# Patient Record
Sex: Male | Born: 1935 | Race: Black or African American | Hispanic: No | Marital: Single
Health system: Southern US, Community
[De-identification: ages and names within clinical notes are randomized; demographics above are authoritative.]

## PROBLEM LIST (undated history)

## (undated) DIAGNOSIS — C20 Malignant neoplasm of rectum: Secondary | ICD-10-CM

## (undated) DIAGNOSIS — I10 Essential (primary) hypertension: Secondary | ICD-10-CM

---

## 2003-12-17 ENCOUNTER — Ambulatory Visit: Payer: Self-pay | Admitting: Hematology and Oncology

## 2004-01-04 ENCOUNTER — Ambulatory Visit: Admission: RE | Admit: 2004-01-04 | Discharge: 2004-02-25 | Payer: Self-pay | Admitting: *Deleted

## 2004-01-05 ENCOUNTER — Ambulatory Visit (HOSPITAL_COMMUNITY): Admission: RE | Admit: 2004-01-05 | Discharge: 2004-01-05 | Payer: Self-pay | Admitting: Hematology and Oncology

## 2004-01-11 ENCOUNTER — Encounter: Admission: RE | Admit: 2004-01-11 | Discharge: 2004-01-11 | Payer: Self-pay | Admitting: Surgery

## 2004-01-12 ENCOUNTER — Ambulatory Visit (HOSPITAL_BASED_OUTPATIENT_CLINIC_OR_DEPARTMENT_OTHER): Admission: RE | Admit: 2004-01-12 | Discharge: 2004-01-12 | Payer: Self-pay | Admitting: Surgery

## 2004-01-12 ENCOUNTER — Ambulatory Visit (HOSPITAL_COMMUNITY): Admission: RE | Admit: 2004-01-12 | Discharge: 2004-01-12 | Payer: Self-pay | Admitting: Surgery

## 2004-02-03 ENCOUNTER — Ambulatory Visit: Payer: Self-pay | Admitting: Hematology and Oncology

## 2004-03-21 ENCOUNTER — Ambulatory Visit: Admission: RE | Admit: 2004-03-21 | Discharge: 2004-04-12 | Payer: Self-pay | Admitting: *Deleted

## 2004-03-31 ENCOUNTER — Inpatient Hospital Stay (HOSPITAL_COMMUNITY): Admission: RE | Admit: 2004-03-31 | Discharge: 2004-04-12 | Payer: Self-pay | Admitting: Surgery

## 2004-03-31 ENCOUNTER — Encounter (INDEPENDENT_AMBULATORY_CARE_PROVIDER_SITE_OTHER): Payer: Self-pay | Admitting: *Deleted

## 2004-06-15 ENCOUNTER — Ambulatory Visit: Payer: Self-pay | Admitting: Hematology and Oncology

## 2004-08-03 ENCOUNTER — Ambulatory Visit: Payer: Self-pay | Admitting: Hematology and Oncology

## 2004-09-25 ENCOUNTER — Ambulatory Visit: Payer: Self-pay | Admitting: Hematology and Oncology

## 2004-12-19 ENCOUNTER — Ambulatory Visit (HOSPITAL_COMMUNITY): Admission: RE | Admit: 2004-12-19 | Discharge: 2004-12-19 | Payer: Self-pay | Admitting: Hematology and Oncology

## 2004-12-21 ENCOUNTER — Ambulatory Visit: Payer: Self-pay | Admitting: Hematology and Oncology

## 2005-02-20 ENCOUNTER — Ambulatory Visit (HOSPITAL_BASED_OUTPATIENT_CLINIC_OR_DEPARTMENT_OTHER): Admission: RE | Admit: 2005-02-20 | Discharge: 2005-02-20 | Payer: Self-pay | Admitting: Surgery

## 2005-04-05 ENCOUNTER — Ambulatory Visit: Payer: Self-pay | Admitting: Hematology and Oncology

## 2005-04-05 LAB — CBC WITH DIFFERENTIAL/PLATELET
Basophils Absolute: 0 10*3/uL (ref 0.0–0.1)
EOS%: 5.1 % (ref 0.0–7.0)
Eosinophils Absolute: 0.1 10*3/uL (ref 0.0–0.5)
MCHC: 35 g/dL (ref 32.0–35.9)
MONO#: 0.5 10*3/uL (ref 0.1–0.9)
NEUT%: 50.2 % (ref 40.0–75.0)
Platelets: 160 10*3/uL (ref 145–400)
RBC: 4.43 10*6/uL (ref 4.20–5.71)
WBC: 2.4 10*3/uL — ABNORMAL LOW (ref 4.0–10.0)

## 2005-04-05 LAB — COMPREHENSIVE METABOLIC PANEL
AST: 15 U/L (ref 0–37)
Albumin: 4.2 g/dL (ref 3.5–5.2)
BUN: 17 mg/dL (ref 6–23)
Calcium: 9.1 mg/dL (ref 8.4–10.5)
Chloride: 105 mEq/L (ref 96–112)
Creatinine, Ser: 1 mg/dL (ref 0.4–1.5)
Glucose, Bld: 120 mg/dL — ABNORMAL HIGH (ref 70–99)
Potassium: 4.4 mEq/L (ref 3.5–5.3)

## 2005-04-05 LAB — CEA: CEA: 0.9 ng/mL (ref 0.0–5.0)

## 2005-06-26 ENCOUNTER — Ambulatory Visit: Payer: Self-pay | Admitting: Hematology and Oncology

## 2005-07-02 LAB — COMPREHENSIVE METABOLIC PANEL
Albumin: 4.4 g/dL (ref 3.5–5.2)
Alkaline Phosphatase: 40 U/L (ref 39–117)
CO2: 24 mEq/L (ref 19–32)
Calcium: 9.1 mg/dL (ref 8.4–10.5)
Chloride: 105 mEq/L (ref 96–112)
Glucose, Bld: 90 mg/dL (ref 70–99)
Potassium: 4.5 mEq/L (ref 3.5–5.3)
Sodium: 136 mEq/L (ref 135–145)
Total Protein: 7.4 g/dL (ref 6.0–8.3)

## 2005-07-02 LAB — CBC WITH DIFFERENTIAL/PLATELET
Eosinophils Absolute: 0.1 10*3/uL (ref 0.0–0.5)
MONO#: 0.7 10*3/uL (ref 0.1–0.9)
MONO%: 19.4 % — ABNORMAL HIGH (ref 0.0–13.0)
NEUT#: 1.9 10*3/uL (ref 1.5–6.5)
RBC: 4.57 10*6/uL (ref 4.20–5.71)
RDW: 11.7 % (ref 11.2–14.6)
WBC: 3.5 10*3/uL — ABNORMAL LOW (ref 4.0–10.0)
lymph#: 0.6 10*3/uL — ABNORMAL LOW (ref 0.9–3.3)

## 2005-07-03 ENCOUNTER — Ambulatory Visit (HOSPITAL_COMMUNITY): Admission: RE | Admit: 2005-07-03 | Discharge: 2005-07-03 | Payer: Self-pay | Admitting: Hematology and Oncology

## 2005-11-13 ENCOUNTER — Ambulatory Visit: Payer: Self-pay | Admitting: Hematology and Oncology

## 2005-11-28 LAB — CBC WITH DIFFERENTIAL/PLATELET
Basophils Absolute: 0 10*3/uL (ref 0.0–0.1)
HCT: 39.5 % (ref 38.7–49.9)
HGB: 13.7 g/dL (ref 13.0–17.1)
LYMPH%: 23.2 % (ref 14.0–48.0)
MCH: 31.3 pg (ref 28.0–33.4)
MONO#: 0.6 10*3/uL (ref 0.1–0.9)
NEUT%: 53.4 % (ref 40.0–75.0)
Platelets: 174 10*3/uL (ref 145–400)
WBC: 3.1 10*3/uL — ABNORMAL LOW (ref 4.0–10.0)
lymph#: 0.7 10*3/uL — ABNORMAL LOW (ref 0.9–3.3)

## 2005-11-28 LAB — COMPREHENSIVE METABOLIC PANEL
Albumin: 4.2 g/dL (ref 3.5–5.2)
BUN: 16 mg/dL (ref 6–23)
CO2: 27 mEq/L (ref 19–32)
Glucose, Bld: 97 mg/dL (ref 70–99)
Potassium: 4.4 mEq/L (ref 3.5–5.3)
Sodium: 142 mEq/L (ref 135–145)
Total Protein: 7.2 g/dL (ref 6.0–8.3)

## 2006-05-20 ENCOUNTER — Ambulatory Visit: Payer: Self-pay | Admitting: Hematology and Oncology

## 2006-05-22 ENCOUNTER — Ambulatory Visit (HOSPITAL_COMMUNITY): Admission: RE | Admit: 2006-05-22 | Discharge: 2006-05-22 | Payer: Self-pay | Admitting: Hematology and Oncology

## 2006-05-22 LAB — COMPREHENSIVE METABOLIC PANEL
ALT: 20 U/L (ref 0–53)
AST: 19 U/L (ref 0–37)
Albumin: 4.1 g/dL (ref 3.5–5.2)
Alkaline Phosphatase: 32 U/L — ABNORMAL LOW (ref 39–117)
Calcium: 9.3 mg/dL (ref 8.4–10.5)
Chloride: 103 mEq/L (ref 96–112)
Potassium: 4.1 mEq/L (ref 3.5–5.3)

## 2006-05-22 LAB — CBC WITH DIFFERENTIAL/PLATELET
BASO%: 0.4 % (ref 0.0–2.0)
EOS%: 3.5 % (ref 0.0–7.0)
MCH: 31.6 pg (ref 28.0–33.4)
MCHC: 35.6 g/dL (ref 32.0–35.9)
RBC: 4.51 10*6/uL (ref 4.20–5.71)
RDW: 12.3 % (ref 11.2–14.6)
lymph#: 0.9 10*3/uL (ref 0.9–3.3)

## 2006-11-15 ENCOUNTER — Ambulatory Visit: Payer: Self-pay | Admitting: Hematology and Oncology

## 2006-11-20 LAB — COMPREHENSIVE METABOLIC PANEL
Albumin: 4.3 g/dL (ref 3.5–5.2)
BUN: 25 mg/dL — ABNORMAL HIGH (ref 6–23)
Calcium: 9 mg/dL (ref 8.4–10.5)
Chloride: 103 mEq/L (ref 96–112)
Glucose, Bld: 89 mg/dL (ref 70–99)
Potassium: 4 mEq/L (ref 3.5–5.3)

## 2006-11-20 LAB — CBC WITH DIFFERENTIAL/PLATELET
Basophils Absolute: 0 10*3/uL (ref 0.0–0.1)
EOS%: 2.3 % (ref 0.0–7.0)
HGB: 13.9 g/dL (ref 13.0–17.1)
LYMPH%: 27.1 % (ref 14.0–48.0)
MCH: 31.3 pg (ref 28.0–33.4)
MCV: 87.7 fL (ref 81.6–98.0)
MONO%: 17 % — ABNORMAL HIGH (ref 0.0–13.0)
Platelets: 159 10*3/uL (ref 145–400)
RBC: 4.43 10*6/uL (ref 4.20–5.71)
RDW: 10.2 % — ABNORMAL LOW (ref 11.2–14.6)

## 2006-11-20 LAB — LACTATE DEHYDROGENASE: LDH: 171 U/L (ref 94–250)

## 2006-11-22 ENCOUNTER — Ambulatory Visit (HOSPITAL_COMMUNITY): Admission: RE | Admit: 2006-11-22 | Discharge: 2006-11-22 | Payer: Self-pay | Admitting: Hematology and Oncology

## 2006-12-22 IMAGING — CT CT CHEST W/O CM
1 of 2 series · 14 of 31 positions shown, 18 images · IV contrast (agent unspecified)
Comparison: 12/19/04.

CLINICAL DATA: Rectal cancer. Diarrhea. Colon resection. Renal insufficiency.
 CHEST CT WITHOUT CONTRAST:
TECHNIQUE: Multidetector CT imaging of the chest was performed following the standard protocol without IV contrast.
TECHNIQUE: Multidetector CT imaging of the abdomen was performed following the standard protocol without IV contrast.
TECHNIQUE: Multidetector CT imaging of the pelvis was performed following the standard protocol without IV contrast.

[Series 2: cap 5.0 b40f st · axial · 0.85mm/px · z∈[-607,-17]mm · 14 of 132 slices shown, 18 images]
[im 7/132  mediastinal]
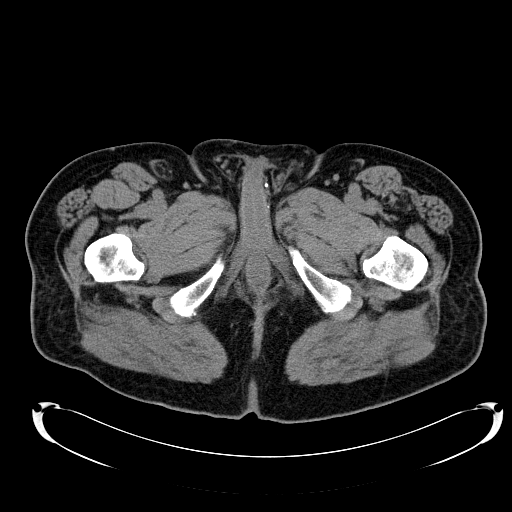
[im 7/132  lung]
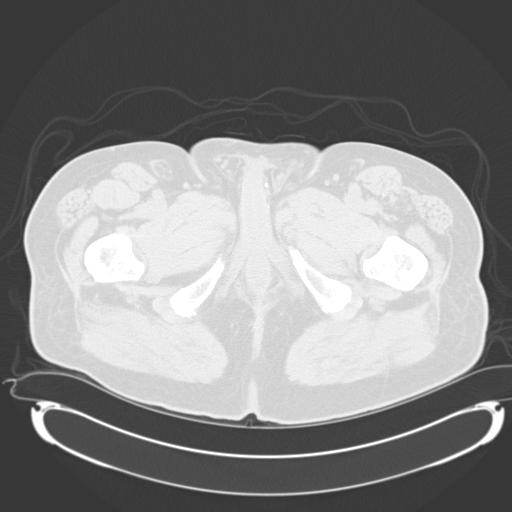
[im 21/132  lung]
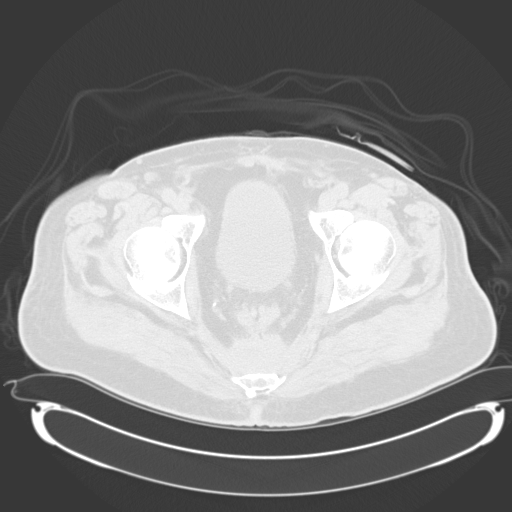
[im 28/132  lung]
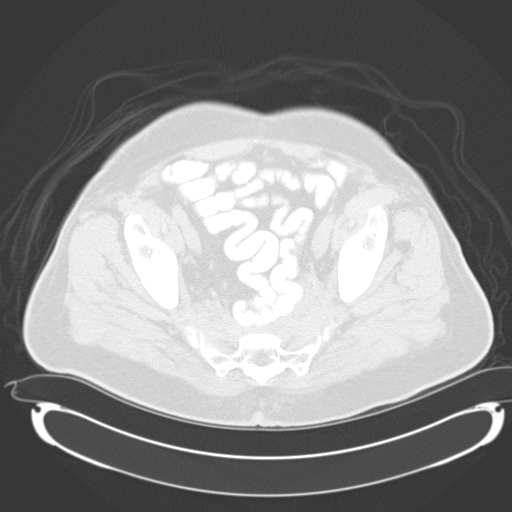
[im 42/132  lung]
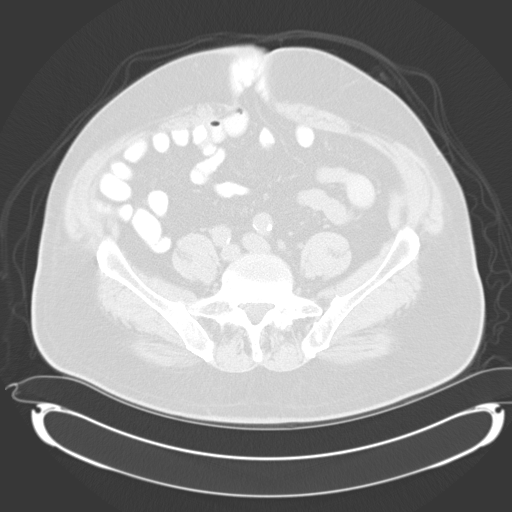
[im 44/132  mediastinal]
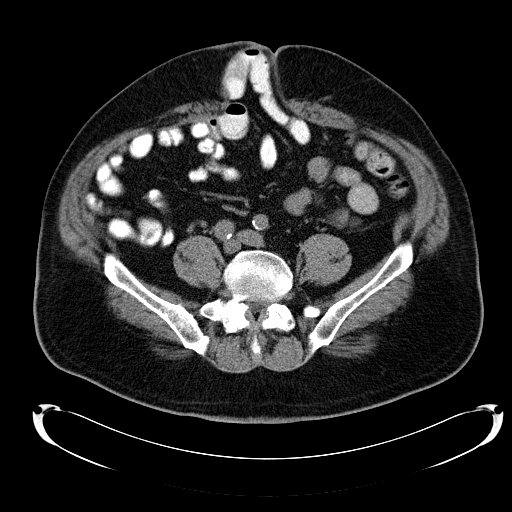
[im 44/132  lung]
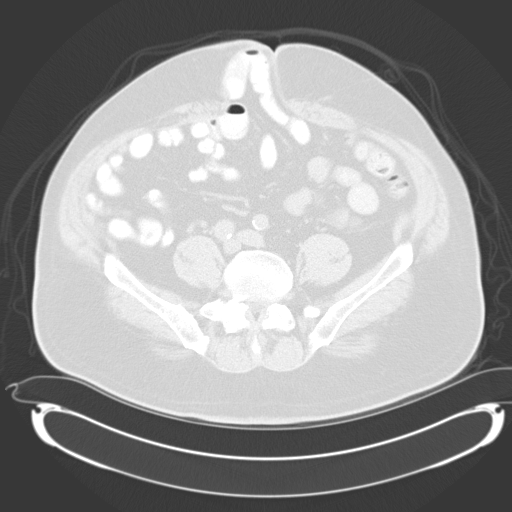
[im 56/132  lung]
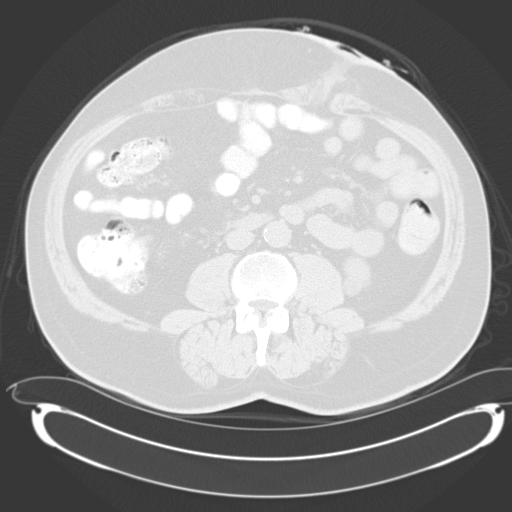
[im 63/132  lung]
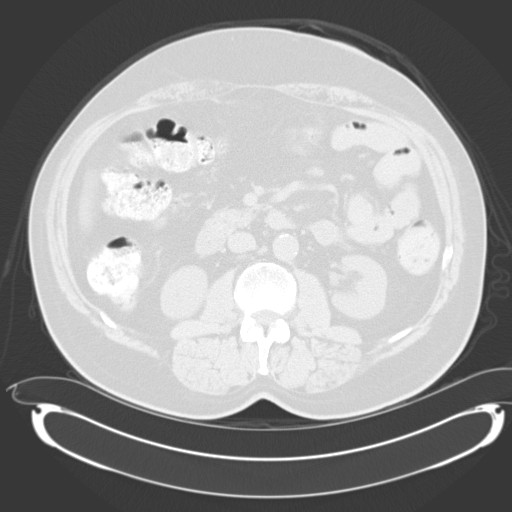
[im 69/132  lung]
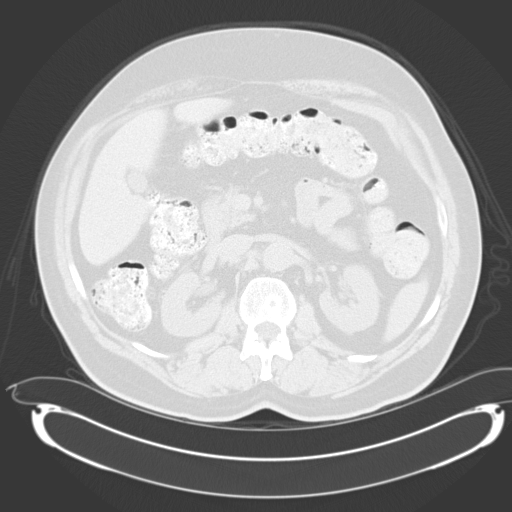
[im 76/132  mediastinal]
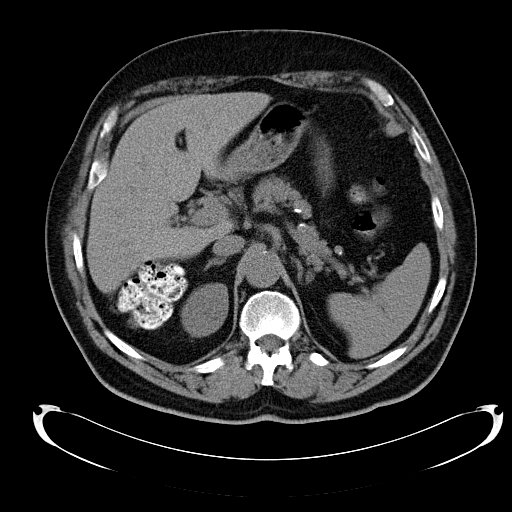
[im 76/132  lung]
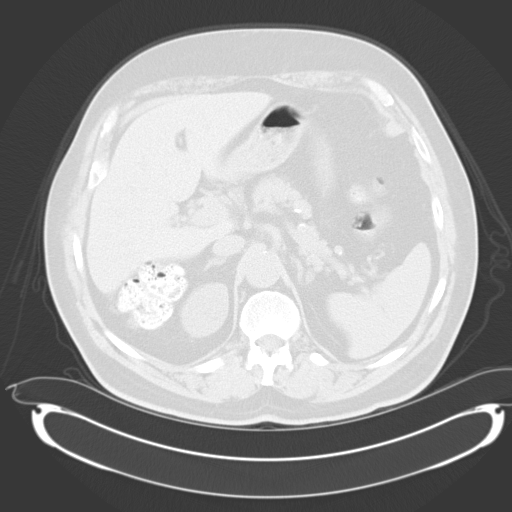
[im 88/132  lung]
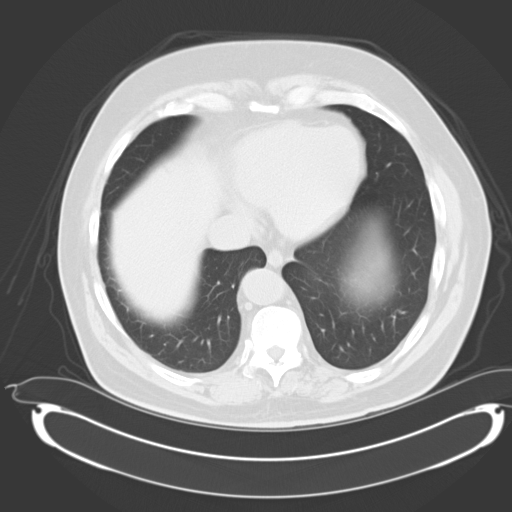
[im 90/132  lung]
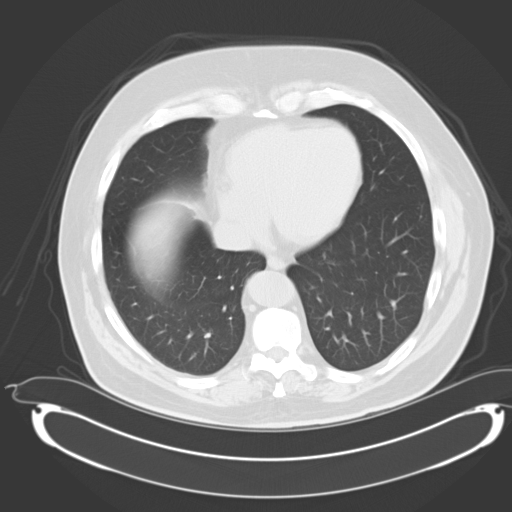
[im 104/132  lung]
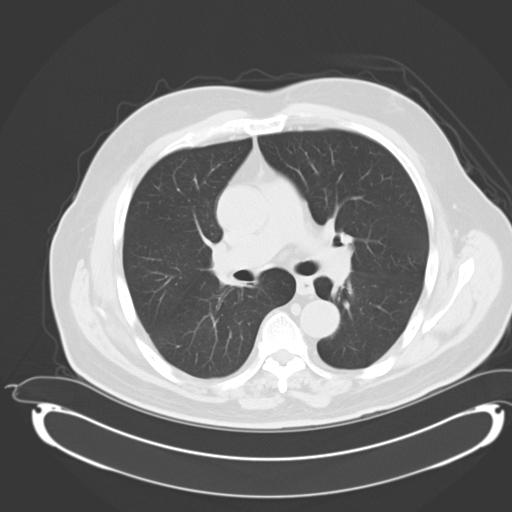
[im 111/132  mediastinal]
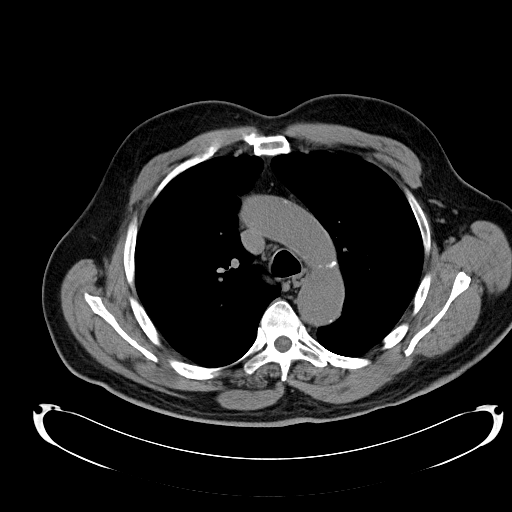
[im 111/132  lung]
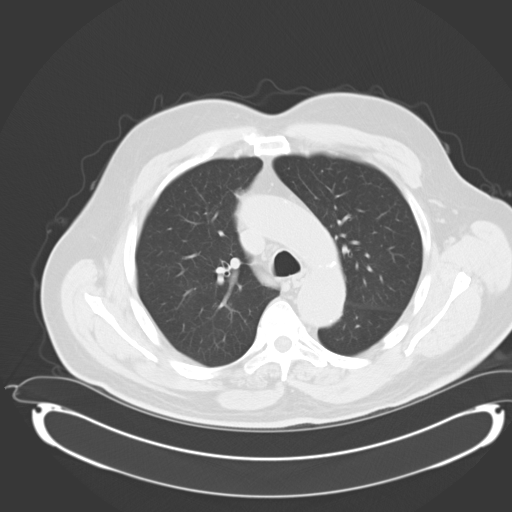
[im 125/132  lung]
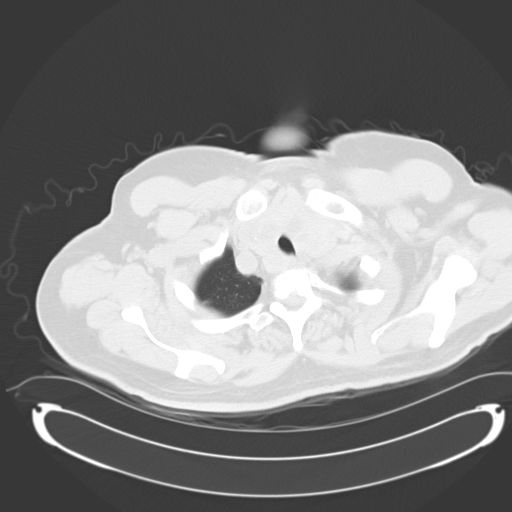

[14 of 31 positions shown; findings below may reference images not displayed]

FINDINGS: Large multinodular fibroid appears stable compared to prior exam and extends down into the upper mediastinum.
 No hilar or mediastinal adenopathy is detected. No lung mass is identified.
IMPRESSION: 1.  Heterogeneous enlarged thyroid likely due to multinodular goiter.
 2.  No adenopathy or pulmonary mass is identified.
 ABDOMEN CT WITHOUT CONTRAST:
FINDINGS: Hypodense lesion in the left hepatic lobe appears stable in size. The faint hypodense lesion in the right hepatic lobe suggested on the prior exam is not readily apparent on today?s exam. This may be due to the lack of IV contrast.
 The pancreas and adrenal glands appear unremarkable, as does the gallbladder.   Small left renal cysts appears stable. No pathologic or retroperitoneal adenopathy is detected. Left-sided colostomy noted. There is a midline umbilical hernia containing small bowel, but with a widely patent neck, with no evidence of strangulation or complicating feature.
IMPRESSION: 1.  The tiny hypodense lesion on the right hepatic lobe is not visualized on today?s noncontrast exam.
 2.  Stable appearance of the left kidney and descending colostomy.
 3.  Stable periumbilical hernia containing a loop of nondilated small bowel.
 PELVIS CT WITHOUT CONTRAST:
FINDINGS: Again noted is presacral soft tissues density, similar in size and distribution compared to the prior exam, and likely due to prior operative intervention. No increase in prominence of this region to suggest recurrent malignancy at this time. No pelvic adenopathy is detected.
IMPRESSION: Stable presacral findings which may be due to prior operative intervention and/or prior radiation therapy. No specific findings of recurrent malignancy.

## 2007-06-17 ENCOUNTER — Ambulatory Visit: Payer: Self-pay | Admitting: Hematology and Oncology

## 2007-06-19 LAB — CBC WITH DIFFERENTIAL/PLATELET
BASO%: 0.5 % (ref 0.0–2.0)
HCT: 39.7 % (ref 38.7–49.9)
LYMPH%: 27 % (ref 14.0–48.0)
MCH: 31 pg (ref 28.0–33.4)
MCHC: 35.1 g/dL (ref 32.0–35.9)
MCV: 88.2 fL (ref 81.6–98.0)
MONO#: 0.5 10*3/uL (ref 0.1–0.9)
MONO%: 16 % — ABNORMAL HIGH (ref 0.0–13.0)
NEUT%: 54 % (ref 40.0–75.0)
Platelets: 185 10*3/uL (ref 145–400)
RBC: 4.5 10*6/uL (ref 4.20–5.71)
WBC: 3.2 10*3/uL — ABNORMAL LOW (ref 4.0–10.0)

## 2007-06-20 LAB — COMPREHENSIVE METABOLIC PANEL
ALT: 21 U/L (ref 0–53)
Alkaline Phosphatase: 35 U/L — ABNORMAL LOW (ref 39–117)
CO2: 21 mEq/L (ref 19–32)
Creatinine, Ser: 1.02 mg/dL (ref 0.40–1.50)
Sodium: 138 mEq/L (ref 135–145)
Total Bilirubin: 0.7 mg/dL (ref 0.3–1.2)

## 2007-06-20 LAB — CEA: CEA: 0.7 ng/mL (ref 0.0–5.0)

## 2007-12-09 ENCOUNTER — Ambulatory Visit: Payer: Self-pay | Admitting: Hematology and Oncology

## 2007-12-09 LAB — CBC WITH DIFFERENTIAL/PLATELET
BASO%: 0.5 % (ref 0.0–2.0)
Basophils Absolute: 0 10*3/uL (ref 0.0–0.1)
EOS%: 2.4 % (ref 0.0–7.0)
HCT: 42.8 % (ref 38.7–49.9)
HGB: 15 g/dL (ref 13.0–17.1)
MCH: 32 pg (ref 28.0–33.4)
MCHC: 35.2 g/dL (ref 32.0–35.9)
MCV: 90.9 fL (ref 81.6–98.0)
MONO%: 14 % — ABNORMAL HIGH (ref 0.0–13.0)
NEUT%: 63.4 % (ref 40.0–75.0)
RDW: 12.6 % (ref 11.2–14.6)
lymph#: 0.8 10*3/uL — ABNORMAL LOW (ref 0.9–3.3)

## 2007-12-10 LAB — COMPREHENSIVE METABOLIC PANEL
ALT: 20 U/L (ref 0–53)
AST: 19 U/L (ref 0–37)
Alkaline Phosphatase: 30 U/L — ABNORMAL LOW (ref 39–117)
BUN: 25 mg/dL — ABNORMAL HIGH (ref 6–23)
Creatinine, Ser: 1.25 mg/dL (ref 0.40–1.50)

## 2007-12-12 ENCOUNTER — Ambulatory Visit (HOSPITAL_COMMUNITY): Admission: RE | Admit: 2007-12-12 | Discharge: 2007-12-12 | Payer: Self-pay | Admitting: Hematology and Oncology

## 2008-06-04 ENCOUNTER — Ambulatory Visit: Payer: Self-pay | Admitting: Hematology and Oncology

## 2008-06-08 LAB — COMPREHENSIVE METABOLIC PANEL
ALT: 22 U/L (ref 0–53)
AST: 20 U/L (ref 0–37)
CO2: 27 mEq/L (ref 19–32)
Calcium: 8.8 mg/dL (ref 8.4–10.5)
Chloride: 104 mEq/L (ref 96–112)
Creatinine, Ser: 1.1 mg/dL (ref 0.40–1.50)
Potassium: 3.9 mEq/L (ref 3.5–5.3)
Sodium: 138 mEq/L (ref 135–145)
Total Protein: 6.5 g/dL (ref 6.0–8.3)

## 2008-06-08 LAB — CBC WITH DIFFERENTIAL/PLATELET
BASO%: 0.2 % (ref 0.0–2.0)
EOS%: 4.3 % (ref 0.0–7.0)
HCT: 40.2 % (ref 38.4–49.9)
MCH: 32.6 pg (ref 27.2–33.4)
MCHC: 36.1 g/dL — ABNORMAL HIGH (ref 32.0–36.0)
MONO#: 0.5 10*3/uL (ref 0.1–0.9)
RBC: 4.46 10*6/uL (ref 4.20–5.82)
RDW: 12.9 % (ref 11.0–14.6)
WBC: 2.9 10*3/uL — ABNORMAL LOW (ref 4.0–10.3)
lymph#: 0.8 10*3/uL — ABNORMAL LOW (ref 0.9–3.3)

## 2008-06-08 LAB — CEA: CEA: 0.9 ng/mL (ref 0.0–5.0)

## 2009-03-09 ENCOUNTER — Ambulatory Visit: Payer: Self-pay | Admitting: Hematology and Oncology

## 2009-03-11 ENCOUNTER — Ambulatory Visit (HOSPITAL_COMMUNITY): Admission: RE | Admit: 2009-03-11 | Discharge: 2009-03-11 | Payer: Self-pay | Admitting: Hematology and Oncology

## 2009-03-11 LAB — CBC WITH DIFFERENTIAL/PLATELET
Basophils Absolute: 0 10*3/uL (ref 0.0–0.1)
Eosinophils Absolute: 0.1 10*3/uL (ref 0.0–0.5)
HGB: 14.6 g/dL (ref 13.0–17.1)
MCV: 88.4 fL (ref 79.3–98.0)
MONO#: 0.6 10*3/uL (ref 0.1–0.9)
MONO%: 14.8 % — ABNORMAL HIGH (ref 0.0–14.0)
NEUT#: 2.4 10*3/uL (ref 1.5–6.5)
Platelets: 156 10*3/uL (ref 140–400)
RBC: 4.74 10*6/uL (ref 4.20–5.82)
RDW: 12.5 % (ref 11.0–14.6)
WBC: 4 10*3/uL (ref 4.0–10.3)

## 2009-03-11 LAB — COMPREHENSIVE METABOLIC PANEL
Albumin: 4.2 g/dL (ref 3.5–5.2)
Alkaline Phosphatase: 29 U/L — ABNORMAL LOW (ref 39–117)
BUN: 17 mg/dL (ref 6–23)
CO2: 28 mEq/L (ref 19–32)
Calcium: 9.2 mg/dL (ref 8.4–10.5)
Glucose, Bld: 102 mg/dL — ABNORMAL HIGH (ref 70–99)
Potassium: 4.2 mEq/L (ref 3.5–5.3)
Sodium: 137 mEq/L (ref 135–145)
Total Protein: 7.3 g/dL (ref 6.0–8.3)

## 2009-03-11 LAB — CEA: CEA: 1 ng/mL (ref 0.0–5.0)

## 2010-01-21 ENCOUNTER — Encounter: Payer: Self-pay | Admitting: Hematology and Oncology

## 2010-01-21 ENCOUNTER — Other Ambulatory Visit: Payer: Self-pay | Admitting: Hematology and Oncology

## 2010-01-21 DIAGNOSIS — C189 Malignant neoplasm of colon, unspecified: Secondary | ICD-10-CM

## 2010-01-22 ENCOUNTER — Encounter: Payer: Self-pay | Admitting: Hematology and Oncology

## 2010-03-14 ENCOUNTER — Other Ambulatory Visit: Payer: Self-pay | Admitting: Hematology and Oncology

## 2010-03-14 ENCOUNTER — Ambulatory Visit (HOSPITAL_COMMUNITY)
Admission: RE | Admit: 2010-03-14 | Discharge: 2010-03-14 | Disposition: A | Discharge status: Home / Self Care | Payer: 59 | Source: Ambulatory Visit | Attending: Hematology and Oncology | Admitting: Hematology and Oncology

## 2010-03-14 ENCOUNTER — Encounter (HOSPITAL_BASED_OUTPATIENT_CLINIC_OR_DEPARTMENT_OTHER): Payer: 59 | Admitting: Hematology and Oncology

## 2010-03-14 ENCOUNTER — Other Ambulatory Visit (HOSPITAL_COMMUNITY): Payer: Self-pay

## 2010-03-14 ENCOUNTER — Encounter (HOSPITAL_COMMUNITY): Payer: Self-pay

## 2010-03-14 DIAGNOSIS — Z923 Personal history of irradiation: Secondary | ICD-10-CM | POA: Insufficient documentation

## 2010-03-14 DIAGNOSIS — K7689 Other specified diseases of liver: Secondary | ICD-10-CM | POA: Insufficient documentation

## 2010-03-14 DIAGNOSIS — C189 Malignant neoplasm of colon, unspecified: Secondary | ICD-10-CM

## 2010-03-14 DIAGNOSIS — Z933 Colostomy status: Secondary | ICD-10-CM | POA: Insufficient documentation

## 2010-03-14 DIAGNOSIS — C20 Malignant neoplasm of rectum: Secondary | ICD-10-CM | POA: Insufficient documentation

## 2010-03-14 DIAGNOSIS — K429 Umbilical hernia without obstruction or gangrene: Secondary | ICD-10-CM | POA: Insufficient documentation

## 2010-03-14 DIAGNOSIS — E042 Nontoxic multinodular goiter: Secondary | ICD-10-CM | POA: Insufficient documentation

## 2010-03-14 HISTORY — DX: Malignant neoplasm of rectum: C20

## 2010-03-14 HISTORY — DX: Essential (primary) hypertension: I10

## 2010-03-14 LAB — CMP (CANCER CENTER ONLY)
ALT(SGPT): 24 U/L (ref 10–47)
Albumin: 4.1 g/dL (ref 3.3–5.5)
CO2: 31 mEq/L (ref 18–33)
Glucose, Bld: 100 mg/dL (ref 73–118)
Potassium: 4.5 mEq/L (ref 3.3–4.7)
Sodium: 140 mEq/L (ref 128–145)
Total Bilirubin: 0.5 mg/dl (ref 0.20–1.60)
Total Protein: 7.6 g/dL (ref 6.4–8.1)

## 2010-03-14 LAB — CBC WITH DIFFERENTIAL/PLATELET
BASO%: 0.3 % (ref 0.0–2.0)
Eosinophils Absolute: 0.1 10*3/uL (ref 0.0–0.5)
LYMPH%: 25 % (ref 14.0–49.0)
MCHC: 34.1 g/dL (ref 32.0–36.0)
MONO#: 0.6 10*3/uL (ref 0.1–0.9)
NEUT#: 2 10*3/uL (ref 1.5–6.5)
Platelets: 159 10*3/uL (ref 140–400)
RBC: 4.77 10*6/uL (ref 4.20–5.82)
RDW: 12.5 % (ref 11.0–14.6)
WBC: 3.6 10*3/uL — ABNORMAL LOW (ref 4.0–10.3)
lymph#: 0.9 10*3/uL (ref 0.9–3.3)

## 2010-03-14 LAB — CEA: CEA: 0.9 ng/mL (ref 0.0–5.0)

## 2010-03-14 MED ORDER — IOHEXOL 300 MG/ML  SOLN
125.0000 mL | Freq: Once | INTRAMUSCULAR | Status: AC | PRN
  Administered 2010-03-14: 125 mL via INTRAVENOUS

## 2010-03-15 ENCOUNTER — Encounter (HOSPITAL_BASED_OUTPATIENT_CLINIC_OR_DEPARTMENT_OTHER): Payer: 59 | Admitting: Hematology and Oncology

## 2010-03-15 DIAGNOSIS — Z85048 Personal history of other malignant neoplasm of rectum, rectosigmoid junction, and anus: Secondary | ICD-10-CM

## 2010-05-19 NOTE — Discharge Summary (Signed)
Chad Key, Chad Key             ACCOUNT NO.:  [id number]   MEDICAL RECORD NO.:  [id number]          PATIENT TYPE:  INP   LOCATION:  0468                         FACILITY:  [facility]   PHYSICIAN:  Thornton Park. Daphine Deutscher, MD  DATE OF BIRTH:  1930/03/07   DATE OF ADMISSION:  03/31/2004  DATE OF DISCHARGE:  04/12/2004                                 DISCHARGE SUMMARY   ADMITTING DIAGNOSIS:  Previous advanced rectal cancer.   PROCEDURE:  Sigmoidoscopy, abdominoperineal resection.   FINAL PATHOLOGY:  No active malignancy noted and 20 nodes were negative for  cancer. Tubular adenoma was noted as well as adjacent ulcerated colonic  mucosa from previous chemoradiation.   COURSE IN THE HOSPITAL:  Timmie Dugue underwent abdominoperineal  resection on March 31, 2004. Postoperatively he had a slow return to  function. His perineal drain was removed as was his ostomy began  functioning. He did have some difficulty with urinary retention. He also had  had some difficulty with breaking out with little bumps on his perineum  bottom. He appeared ready for discharge on April 12, 2004. He was given  Tylox to take for pain and asked to return to the office in 3 weeks. His  condition was improved and his pathology report showed no residual tumor.      MBM/MEDQ  D:  05/22/2004  T:  05/22/2004  Job:  [id number]   cc:   Medical Oncology   Radiation Oncology   Lucrezia Starch. Earlene Plater, M.D.  [address]  [place]  [place] [address]  Fax: [phone]

## 2010-05-19 NOTE — Op Note (Signed)
NAME:  CJ, EDGELL             ACCOUNT NO.:  [id number]   MEDICAL RECORD NO.:  [id number]          PATIENT TYPE:  AMB   LOCATION:  DSC                          FACILITY:  [facility]   PHYSICIAN:  Thornton Park. Daphine Deutscher, MD  DATE OF BIRTH:  [birth year]   DATE OF PROCEDURE:  DATE OF DISCHARGE:                                 OPERATIVE REPORT   PREOPERATIVE DIAGNOSES:  Rectal cancer.   POSTOPERATIVE DIAGNOSES:  Rectal cancer.   PROCEDURE:  Insertion of left subclavian Port-A-Cath.   SURGEON:  Thornton Park. Daphine Deutscher, MD.   ANESTHESIA:  MAC.   DESCRIPTION OF PROCEDURE:  Errol Ala is a 77 year old man with  advanced rectal cancer undergoing chemoradiation.  The patient was brought  to room 8 at [facility] on January 12, 2004 and given sedation. The  chest was prepped widely with Betadine and draped sterilely.  With him in  the Trendelenburg position, the left subclavian vein was cannulated on his  first pass and a wire passed into the superior vena cava and into the right  atrium. This was pulled back into the cava.  I then fashioned a port site  down below this and tunneled the catheter and flushed it with heparinized  saline. I then passed it through the obturator peel-away sheath complex  after removing the wire and obturator and peeled back the sheath and under  fluoro, I found that it was coiling up initially.  Eventually I got him to  turn his head back toward me and we were able to direct this down into the  superior vena cava and right atrium.  I pulled it back into the superior  vena cava.  I cut it and attached it to the port with it locking  and then sutured in place with two sutures of 2-0 Prolene. The pocket was  then closed with 4-0 Vicryl subcutaneously and with Benzoin and Steri-  Strips. Also the insertion site was closed with a single 4-0 Vicryl.  The  patient seemed to tolerate this procedure well and he was taken to the  recovery room in satisfactory  condition.      Matt   MBM/MEDQ  D:  01/12/2004  T:  01/12/2004  Job:  [id number]   cc:   Medical Oncology   Radiation Oncology   Lianne Bushy, M.D.  [address]  [place]  [place] [address]  Fax: [phone]

## 2010-05-19 NOTE — Consult Note (Signed)
NAMEEMIL, WEIGOLD             ACCOUNT NO.:  [id number]   MEDICAL RECORD NO.:  [id number]          PATIENT TYPE:  INP   LOCATION:  0468                         FACILITY:  [facility]   PHYSICIAN:  Thornton Park. Daphine Deutscher, MD  DATE OF BIRTH:  [birth year]   DATE OF CONSULTATION:  04/08/2004  DATE OF DISCHARGE:                                   CONSULTATION   I was asked to consult this pleasant 77 year old African-American male.  He  had abdominoperineal resection on March 31, 2004, by Dr. Luretha Murphy.  He  has had two failed voiding trials and currently on Flomax.  Preoperatively,  his only urinary complaint was frequency.  He has no previous obstructive  symptoms other than just some variable flow.   PHYSICAL EXAMINATION:  GENERAL:  A 77 year old African-American male in no  acute distress.  VITAL SIGNS:  Temperature 99.3, pulse 90, respirations 18, blood pressure  152/68, saturations 99%.  ABDOMEN:  Soft, midline incision intact.  Colostomy draining diarrhea  stools.  GU:  Penis with mild edema, no ecchymosis noted on the penis or scrotum.  His cath I&O amount hovering from 325-400 mL.   ASSESSMENT:  Failed voiding trial, urinary retention, status post  abdominoperineal resection.  Continue Flomax 0.4 mg daily.  Please reinsert  Foley.  He is to begin Macrobid 100 mg 1 p.o. b.i.d. empirically.  The  patient may be discharged home with Foley and is to follow in our office in  1 week with myself, Cammy Copa, nurse practitioner, and  prescriptions are on the chart.  Thank you for this consult.      JML/MEDQ  D:  04/08/2004  T:  04/08/2004  Job:  [id number]

## 2010-05-19 NOTE — Op Note (Signed)
NAME:  ZACKERY, BRINE             ACCOUNT NO.:  [id number]   MEDICAL RECORD NO.:  [id number]           PATIENT TYPE:   LOCATION:                                 FACILITY:   PHYSICIAN:  Thornton Park. Daphine Deutscher, MD       DATE OF BIRTH:   DATE OF PROCEDURE:  02/20/2005  DATE OF DISCHARGE:                                 OPERATIVE REPORT   CC[place] [id number].   PREOP DIAGNOSIS:  History of rectal cancer, need for chemotherapy.   SURGEON:  Thornton Park. Daphine Deutscher, MD   ANESTHESIA:  MAC with local 1% lidocaine.   PROCEDURE:  Explantation of left subclavian Port-A-Cath.   DESCRIPTION OF PROCEDURE:  Mr. Endsley was taken to room 4 on February 20, 2005 and the left chest was prepped with Betadine and draped sterilely. I  injected the previous incision with 1% lidocaine; and made a small  transverse incision through it. I removed the 2 Prolene sutures holding the  plastic port in place. I explanted it without difficulty. There is no back  bleeding. The wound was then closed with a running subcuticular 5-0 Vicryl  and Benzoin Steri-Strips. The patient tolerated the procedure well; and was  taken to recovery room in satisfactory condition. He was given Vicodin for  pain; and a note that he could return to work tomorrow.      Thornton Park Daphine Deutscher, MD  Electronically Signed     MBM/MEDQ  D:  02/20/2005  T:  02/20/2005  Job:  [id number]   cc:   Lianne Bushy, M.D.  Fax: [phone]

## 2010-05-19 NOTE — Consult Note (Signed)
NAMEUZIAH, SORTER             ACCOUNT NO.:  [id number]   MEDICAL RECORD NO.:  [id number]          PATIENT TYPE:  INP   LOCATION:  0468                           FACILITY:   PHYSICIAN:  Lucrezia Starch. Earlene Plater, M.D.  DATE OF BIRTH:  [birth year]   DATE OF CONSULTATION:  04/09/2004  DATE OF DISCHARGE:                                   CONSULTATION   RE CONSULT NOTE:   CHIEF COMPLAINT:  Urinary retention.  Failed voiding trials x2, status post  AP repair by Molli Hazard B. Daphine Deutscher, M.D.   HISTORY OF PRESENT ILLNESS:  This is a pleasant, 77 year old African-  American male who was diagnosed recently with rectal tumor, subsequently  underwent radiation and chemotherapy and subsequently underwent secondary  abdominoperineal resection by Dr. Luretha Murphy.  Preoperatively, the  patient had some urinary frequency and some variable flow otherwise no other  urinary complaints.  He never had any other type of urinary surgeries or  trauma.  Since surgery, he has undergone two failed voiding trials despite  alpha blocker therapy.   PAST MEDICAL HISTORY:  Significant for hypertension, status post left chest  Port-A-Cath, hiatal hernia.   PAST SURGICAL HISTORY:  Port-A-Cath inserted on January 07, 2004, left total  knee in 2000, right total knee in 2003, and he has had bilateral inguinal  hernia repairs.   CURRENT MEDICATIONS:  Diovan 80 mg once daily and he was on Coumadin  preoperatively; however, that has been discontinued.   ALLERGIES:  None.   SOCIAL HISTORY:  He is a nondrinker.  He smoked cigars for 50 years.   FAMILY HISTORY:  Not pertinent.   REVIEW OF SYSTEMS:  Negative chest pain.  Negative shortness of breath.  Some nausea, vomiting, and diarrhea, preoperatively frequency and less flow  of urine.  No neurological complaints.  No diaphoresis.  Otherwise benign.   PHYSICAL EXAMINATION:  A 77 year old African-American male in no acute  distress.  Vital signs are stable.  HEENT:   Normocephalic.  Neck is supple.  Lungs are clear to auscultation.  Cardiac:  Regular rate and rhythm.  Abdomen soft, benign.  Incision intact.  Colostomy draining diarrheal  stools.  GU:  Penis with mild ectopy.  No ecchymosis.  No numbness in the  penis or scrotum.  With an in and out catheter, amount is 325-400 cubic  centimeters.  Extremities within normal limits.  Neurologically, he is alert  and oriented x3 with an appropriate mood and affect.   IMPRESSION:  1.  Rectal carcinoma status post chemotherapy, radiation abdominoperineal      resection.  2.  Preoperative, mild urinary obstructive and irritative symptoms.  3.  Urinary retention secondary to recumbency, narcotic use, anesthesia, and      hydration, possible __________  type cannot be ruled out due to      abdominoperineal resection.  4.  Foley catheter dependent.   PLAN:  1.  Will leave Foley catheter in place.  2.  Continue Flomax 0.4 mg daily.  3.  Begin Macrobid 100 mg one p.o. b.i.d. empirically.  4.  He is to be discharged home with  Foley catheter in place and will follow      up in our office in one week post discharge for a __________  flow if      unable to complete, then will consider cystoscopy with urodynamic      studies.      JML/MEDQ  D:  04/09/2004  T:  04/09/2004  Job:  [id number]

## 2010-05-19 NOTE — Op Note (Signed)
[name], [name]             ACCOUNT NO.:  [id number]   MEDICAL RECORD NO.:  [id number]          PATIENT TYPE:  INP   LOCATION:  0163                         FACILITY:  [facility]   PHYSICIAN:  Thornton Park. Daphine Deutscher, MD  DATE OF BIRTH:  21-Dec-1930   DATE OF PROCEDURE:  03/31/2004  DATE OF DISCHARGE:                                 OPERATIVE REPORT   PREOPERATIVE DIAGNOSIS:  Rectal cancer.   POSTOPERATIVE DIAGNOSIS:  Rectal cancer, status post radiation and chemo.   SURGEON:  Dr. Daphine Deutscher.   ASSISTANT:  Dr. Luan Pulling   ANESTHESIA:  General endotracheal positioned supine in the yellowfin  stirrups.   ESTIMATED BLOOD LOSS:  500 mL.   DRAINS:  Two 19 mm Blake drains.   DESCRIPTION OF PROCEDURE:  Mr. Cona was taken to room 11, given general  anesthesia.  He received preop heparin and antibiotics.  Preoperatively he  was marked for the permanent ostomy through the left mid abdomen by the  ostomy nurse.  First a sigmoidoscopic exam was performed which noted it was  still quite low and some scar tissue and my initial impression was that I  would still not be able to get an adequate margin distally.  However, I went  at this with the rectum open not comitted to abdominoperineal resection from  the beginning.  Lower midline incision was made, and the abdomen was  entered.  The sigmoid colon was mobilized and then divided.  The mesentery  _____________ with 2-0 silk suture ligatures when needed.  This was carried  down at the hollow of the pelvis and on into the pelvis.  The patient had a  very small pelvis and was very fat on the inside.  However, we carried down  into the pelvis and mobilizing the lateral bands with the harmonic scalpel  and mobilized the rectum anteriorly.  I could feel the thickening of the  area that was involved.  At that point, I was low and felt that I could not  adequately do a low anterior resection and get a margin.  I elected to go  below, and Dr.  Luan Pulling stayed above.  I began the perineal resection by  ligating the rectum in a circumferential pursestring of 0 silk.  I then  began with the Bovie, dissecting away from the bowel and carried a very  tedious hour long dissection below, but did stay out of the lumen and got a  good radial margin, working my way up carefully and getting into the sacral  space.  Combined with Dr. Luan Pulling, we got this specimen out in toto.  This was sent for permanent sections.  I then washed the perineum with a  Betadine wash from above.  Then I closed the pelvis with interrupted #1 PDS.  Skin was closed with #3-0 nylon.   Meanwhile, the ostomy was made in the aforementioned marked area, and we  matured the skin.  Sponge and needle counts reported as correct.  Midline  wound was closed with #1 PDS from above and below.  The wound was washed,  closed with staples.  The  patient seemed to tolerate the procedure well,  taken to the recovery room prior to transfer to the ICU.      MBM/MEDQ  D:  03/31/2004  T:  03/31/2004  Job:  [id number]

## 2011-01-17 ENCOUNTER — Telehealth: Payer: Self-pay | Admitting: Hematology and Oncology

## 2011-01-17 NOTE — Telephone Encounter (Signed)
l/m and mailed 3/8 3/12 appt    aom

## 2011-03-09 ENCOUNTER — Telehealth: Payer: Self-pay | Admitting: Hematology and Oncology

## 2011-03-09 ENCOUNTER — Other Ambulatory Visit (HOSPITAL_BASED_OUTPATIENT_CLINIC_OR_DEPARTMENT_OTHER): Payer: 59

## 2011-03-09 DIAGNOSIS — C20 Malignant neoplasm of rectum: Secondary | ICD-10-CM

## 2011-03-09 LAB — COMPREHENSIVE METABOLIC PANEL WITH GFR
ALT: 23 U/L (ref 0–53)
AST: 21 U/L (ref 0–37)
Albumin: 4.4 g/dL (ref 3.5–5.2)
Alkaline Phosphatase: 35 U/L — ABNORMAL LOW (ref 39–117)
BUN: 16 mg/dL (ref 6–23)
CO2: 27 meq/L (ref 19–32)
Calcium: 9.2 mg/dL (ref 8.4–10.5)
Chloride: 101 meq/L (ref 96–112)
Creatinine, Ser: 1.21 mg/dL (ref 0.50–1.35)
Glucose, Bld: 120 mg/dL — ABNORMAL HIGH (ref 70–99)
Potassium: 4.3 meq/L (ref 3.5–5.3)
Sodium: 140 meq/L (ref 135–145)
Total Bilirubin: 0.6 mg/dL (ref 0.3–1.2)
Total Protein: 7 g/dL (ref 6.0–8.3)

## 2011-03-09 LAB — CBC WITH DIFFERENTIAL/PLATELET
BASO%: 0.3 % (ref 0.0–2.0)
MCHC: 34.5 g/dL (ref 32.0–36.0)
MONO#: 0.5 10*3/uL (ref 0.1–0.9)
RBC: 4.76 10*6/uL (ref 4.20–5.82)
RDW: 12.6 % (ref 11.0–14.6)
WBC: 3.3 10*3/uL — ABNORMAL LOW (ref 4.0–10.3)
lymph#: 1.1 10*3/uL (ref 0.9–3.3)
nRBC: 0 % (ref 0–0)

## 2011-03-09 LAB — CEA: CEA: <0.5 ng/mL (ref 0.0–5.0)

## 2011-03-09 NOTE — Telephone Encounter (Signed)
Pt came in today and asked that 3/12 appt be r/s'd for the afternoon as he cannot do morning appts. Pt made aware that i would s/w LO and call him. S/w LO and per LO call pt and ask that he keep KSA for 3/12 @ 10 am as she has no other availability, no NP and cannot see him in the afternoon. Called pt on both home and cell phones but was not able to reach him. lmonvm for pt re above and asked that he keep KSA for 3/12 @ 10 am. vm on cell not set up.

## 2011-03-09 NOTE — Telephone Encounter (Signed)
Pt came to see me after lb today and states he cannot come 3/12 in the AM. Per pt he needs PM appt. Message to nurse.

## 2011-03-12 ENCOUNTER — Telehealth: Payer: Self-pay | Admitting: *Deleted

## 2011-03-12 NOTE — Telephone Encounter (Signed)
Spoke with pt on cell phone and gave pt new date and time for f/u appt on 03/23/11  At  245 pm.   Pr verbalized understanding.

## 2011-03-13 ENCOUNTER — Ambulatory Visit: Payer: 59 | Admitting: Hematology and Oncology

## 2011-03-23 ENCOUNTER — Ambulatory Visit: Payer: 59 | Admitting: Hematology and Oncology

## 2011-03-23 ENCOUNTER — Telehealth: Payer: Self-pay | Admitting: Hematology and Oncology

## 2011-03-23 ENCOUNTER — Ambulatory Visit (HOSPITAL_BASED_OUTPATIENT_CLINIC_OR_DEPARTMENT_OTHER): Payer: 59 | Admitting: Physician Assistant

## 2011-03-23 ENCOUNTER — Other Ambulatory Visit: Payer: Self-pay | Admitting: Physician Assistant

## 2011-03-23 VITALS — BP 137/81 | HR 79 | Temp 97.6°F | Ht 73.0 in | Wt 231.8 lb

## 2011-03-23 DIAGNOSIS — Z85048 Personal history of other malignant neoplasm of rectum, rectosigmoid junction, and anus: Secondary | ICD-10-CM

## 2011-03-23 DIAGNOSIS — C20 Malignant neoplasm of rectum: Secondary | ICD-10-CM

## 2011-03-23 DIAGNOSIS — Z9221 Personal history of antineoplastic chemotherapy: Secondary | ICD-10-CM

## 2011-03-23 DIAGNOSIS — Z923 Personal history of irradiation: Secondary | ICD-10-CM

## 2011-03-23 NOTE — Telephone Encounter (Signed)
appts made and printed for pt aom °

## 2011-03-23 NOTE — Progress Notes (Signed)
This office note has been dictated.

## 2011-03-26 NOTE — Progress Notes (Signed)
CC:   Thornton Park. Daphine Deutscher, MD Bernette Redbird, M.D. [facility]  IDENTIFYING STATEMENT:  Mr. Chad Key is a 78 year old black male who presents for followup.  PROBLEM LIST: 1. 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     a.     Status post continuous infusion 5-FU with external radiation      therapy from 01/07/2004 through 02/28/2004.     b.     Status post abdominoperineal resection 03/31/2004 with      complete pathological response.     c.     Status post adjuvant Xeloda from June 2006 through November      2006.     d.     Status post Port-A-Cath removal 02/20/2005. 2. Hypertension. 3. Hyperlipidemia.  INTERIM HISTORY:  Mr. Dumond reports since his last clinic visit 1 year ago he has had overall normal energy level.  He states he was evaluated in the last month at his primary care office due to what was described as a pulled muscle.  During his evaluation he was advised to take over-the-counter anti-inflammatories such as ibuprofen with some relief of symptoms although he does still report some soreness with certain movements most pronounced on the left side.  He plans to return to [facility] for further evaluation and also to schedule a complete physical because he states it has been some time since he has had a complete physical. No fevers, chills or night sweats.  No dyspnea or cough.  He has normal appetite and has had no problems with nausea, vomiting, constipation or diarrhea.  No dysuria, frequency or hematuria.  No alteration in sensation or balance or swelling of extremities.  CURRENT MEDICATIONS:  Reviewed and recorded.  PHYSICAL EXAMINATION:  Vital signs:  Temperature is 97.6, heart rate 79, respirations 20, blood pressure 137/81, weight 231.8 pounds.  General: This is a well-developed, well-nourished white male in no acute distress.  HEENT:  Sclerae nonicteric.  There is no oral  thrush, mucositis.  Skin:  No rash or lesions.  Lymphs:  No cervical, supraclavicular, axillary or inguinal lymphadenopathy.  Cardiac: Regular rate and rhythm without murmurs or gallops.  Peripheral pulses are 2+.  Chest:  Lungs clear to auscultation.  Abdomen:  Positive bowel sounds.  Soft, nontender, nondistended.  There is no organomegaly.  He has a left quadrant colostomy.  Extremities:  No edema, cyanosis or calf tenderness.  Neurological:  Alert and oriented x3.  Strength, sensation and coordination all grossly intact.  LABS:  Laboratory data from 03/09/2011, CBC with diff reveals white blood count of 3.3, hemoglobin 14.3, hematocrit 41.5, platelets of 170, ANC of 1.6 and MCV of 87.2.  Chemistries reveal a sodium of 140, potassium 4.3, chloride 101, BUN 16, creatinine 1.21, glucose of 120, alkaline phosphatase 35, AST 21, ALT 23, total protein 7.0, albumin 4.4, calcium 9.2 and tumor marker CEA of less than 0.5.  RADIOGRAPHIC STUDIES: 1. CT of the chest, abdomen and pelvis with contrast      Was on 03/14/2010 which revealed no evidence of     metastatic disease or other acute findings in the thorax, stable     multinodular goiter with substernal extension, stable presacral     soft tissue density consistent with post treatment changes.  No     evidence of recurrent or metastatic carcinoma or other acute     findings and stable periumbilical hernia containing small bowel. 2.  The patient's last colonoscopy was 09/14/2009.  Pathology of a     cecal polyp revealed benign polypoid colonic mucosa with mild     active inflammation and hyperplastic epithelial changes.  No     granulomata adenomatous epithelium or malignancy identified. 3. Per Dr. Dalene Carrow the patient will be scheduled for followup visit in     1 year's time.  The patient will have CBC with diff, CMET, CEA, and a CT         scan of the chest, abdomen, and pelvis a few days     prior to followup.  He is advised to call in the  interim if any     questions or problems.    ______________________________ Michail Sermon, MSN, ANP, BC RH/MEDQ  D:  03/23/2011  T:  03/23/2011  Job:  [id number]

## 2011-03-30 ENCOUNTER — Other Ambulatory Visit: Payer: Self-pay | Admitting: Physician Assistant

## 2012-02-02 ENCOUNTER — Encounter: Payer: Self-pay | Admitting: Oncology

## 2012-02-02 ENCOUNTER — Telehealth: Payer: Self-pay | Admitting: Oncology

## 2012-02-02 NOTE — Telephone Encounter (Signed)
called pt and left message r/s fr 3/21, now a pt of Dr. Clelia Croft , former Dr.Odogwu

## 2012-03-14 ENCOUNTER — Ambulatory Visit (HOSPITAL_COMMUNITY)
Admission: RE | Admit: 2012-03-14 | Discharge: 2012-03-14 | Disposition: A | Discharge status: Home / Self Care | Payer: Medicare Other | Source: Ambulatory Visit | Attending: Physician Assistant | Admitting: Physician Assistant

## 2012-03-14 ENCOUNTER — Other Ambulatory Visit (HOSPITAL_BASED_OUTPATIENT_CLINIC_OR_DEPARTMENT_OTHER): Payer: Medicare Other | Admitting: Lab

## 2012-03-14 DIAGNOSIS — E042 Nontoxic multinodular goiter: Secondary | ICD-10-CM | POA: Insufficient documentation

## 2012-03-14 DIAGNOSIS — C20 Malignant neoplasm of rectum: Secondary | ICD-10-CM

## 2012-03-14 DIAGNOSIS — E041 Nontoxic single thyroid nodule: Secondary | ICD-10-CM | POA: Insufficient documentation

## 2012-03-14 DIAGNOSIS — Q619 Cystic kidney disease, unspecified: Secondary | ICD-10-CM | POA: Insufficient documentation

## 2012-03-14 LAB — CBC WITH DIFFERENTIAL/PLATELET
BASO%: 0.5 % (ref 0.0–2.0)
EOS%: 2.9 % (ref 0.0–7.0)
HCT: 43.1 % (ref 38.4–49.9)
LYMPH%: 27.9 % (ref 14.0–49.0)
MCH: 29.8 pg (ref 27.2–33.4)
MCHC: 33.7 g/dL (ref 32.0–36.0)
MONO#: 0.7 10*3/uL (ref 0.1–0.9)
NEUT%: 51.1 % (ref 39.0–75.0)
Platelets: 177 10*3/uL (ref 140–400)

## 2012-03-14 LAB — COMPREHENSIVE METABOLIC PANEL (CC13)
AST: 17 U/L (ref 5–34)
BUN: 20.7 mg/dL (ref 7.0–26.0)
Calcium: 9.6 mg/dL (ref 8.4–10.4)
Chloride: 103 mEq/L (ref 98–107)
Creatinine: 1.2 mg/dL (ref 0.7–1.3)
Glucose: 98 mg/dl (ref 70–99)

## 2012-03-14 MED ORDER — IOHEXOL 300 MG/ML  SOLN
100.0000 mL | Freq: Once | INTRAMUSCULAR | Status: AC | PRN
  Administered 2012-03-14: 100 mL via INTRAVENOUS

## 2012-03-21 ENCOUNTER — Ambulatory Visit: Payer: 59 | Admitting: Hematology and Oncology

## 2012-03-25 ENCOUNTER — Ambulatory Visit (HOSPITAL_BASED_OUTPATIENT_CLINIC_OR_DEPARTMENT_OTHER): Payer: Medicare Other | Admitting: Oncology

## 2012-03-25 ENCOUNTER — Telehealth: Payer: Self-pay | Admitting: Oncology

## 2012-03-25 VITALS — BP 132/79 | HR 97 | Temp 97.0°F | Resp 20 | Ht 73.0 in | Wt 234.2 lb

## 2012-03-25 DIAGNOSIS — C20 Malignant neoplasm of rectum: Secondary | ICD-10-CM

## 2012-03-25 NOTE — Progress Notes (Signed)
Hematology and Oncology Follow Up Visit  Chad Key [id number] 11/15/1930 79 y.o. 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  Prior Therapy:  He is status post continuous infusion 5-FU with external radiation therapy from 01/07/2004 through 02/28/2004. He is Status post abdominoperineal resection 03/31/2004 with complete pathological response.  He is Status post adjuvant Xeloda from June 2006 through November 2006. He is S/P Status post Port-A-Cath removal 02/20/2005.   Current therapy: Observation and follow up.   Interim History:  Chad Key presents today for a follow up visit. He reports since his last clinic visit 1 year ago he has had overall normal energy level. He is not fevers, chills or night sweats. No dyspnea or cough. He has normal appetite and has had no problems with nausea, vomiting, constipation or diarrhea. No dysuria, frequency or hematuria. No alteration in sensation or balance or swelling of extremities.   Medications: I have reviewed the patient's current medications. Current outpatient prescriptions:simvastatin (ZOCOR) 40 MG tablet, Take 40 mg by mouth every evening., Disp: , Rfl: ;  valsartan (DIOVAN) 80 MG tablet, Take 80 mg by mouth daily., Disp: , Rfl:   Allergies: No Known Allergies  Past Medical History, Surgical history, Social history, and Family History were reviewed and updated.  Review of Systems: Constitutional:  Negative for fever, chills, night sweats, anorexia, weight loss, pain. Cardiovascular: no chest pain or dyspnea on exertion Respiratory: negative Neurological: negative Dermatological: negative ENT: negative Skin: Negative. Gastrointestinal: negative Genito-Urinary: negative Hematological and Lymphatic: negative Breast: negative Musculoskeletal: negative Remaining ROS negative. Physical Exam: Blood pressure 132/79, pulse 97, temperature  97 F (36.1 C), temperature source Oral, resp. rate 20, height 6\' 1"  (1.854 m), weight 234 lb 3.2 oz (106.232 kg). ECOG:  General appearance: alert Head: Normocephalic, without obvious abnormality, atraumatic Neck: no adenopathy, no carotid bruit, no JVD, supple, symmetrical, trachea midline and thyroid not enlarged, symmetric, no tenderness/mass/nodules Lymph nodes: Cervical, supraclavicular, and axillary nodes normal. Heart:regular rate and rhythm, S1, S2 normal, no murmur, click, rub or gallop Lung:chest clear, no wheezing, rales, normal symmetric air entry Abdomin: soft, non-tender, without masses or organomegaly EXT:no erythema, induration, or nodules   Lab Results: Lab Results  Component Value Date   WBC 4.1 03/14/2012   HGB 14.5 03/14/2012   HCT 43.1 03/14/2012   MCV 88.6 03/14/2012   PLT 177 03/14/2012     Chemistry      Component Value Date/Time   NA 139 03/14/2012 1444   NA 140 03/09/2011 1419   NA 140 03/14/2010 1352   K 4.6 03/14/2012 1444   K 4.3 03/09/2011 1419   K 4.5 03/14/2010 1352   CL 103 03/14/2012 1444   CL 101 03/09/2011 1419   CL 98 03/14/2010 1352   CO2 26 03/14/2012 1444   CO2 27 03/09/2011 1419   CO2 31 03/14/2010 1352   BUN 20.7 03/14/2012 1444   BUN 16 03/09/2011 1419   BUN 18 03/14/2010 1352   CREATININE 1.2 03/14/2012 1444   CREATININE 1.21 03/09/2011 1419   CREATININE 1.2 03/14/2010 1352      Component Value Date/Time   CALCIUM 9.6 03/14/2012 1444   CALCIUM 9.2 03/09/2011 1419   CALCIUM 9.3 03/14/2010 1352   ALKPHOS 39* 03/14/2012 1444   ALKPHOS 35* 03/09/2011 1419   ALKPHOS 42 03/14/2010 1352   AST 17 03/14/2012 1444   AST 21 03/09/2011 1419   AST 21  03/14/2010 1352   ALT 18 03/14/2012 1444   ALT 23 03/09/2011 1419   BILITOT 0.51 03/14/2012 1444   BILITOT 0.6 03/09/2011 1419   BILITOT 0.50 03/14/2010 1352       Radiological Studies:  CT CHEST, ABDOMEN AND PELVIS WITH CONTRAST  Technique: Multidetector CT imaging of the chest, abdomen and  pelvis was performed  following the standard protocol during bolus  administration of intravenous contrast.  Contrast: OMNIPAQUE IOHEXOL 300 MG/ML SOLN  Comparison: CT 03/26/2011, 03/14/2010  CT CHEST  Findings: No axillary or supraclavicular lymphadenopathy. There  is bulky enlargement of the left and right lobe of the thyroid  gland with multiple nodular cysts and coarse calcifications. This  is not changed from comparison exam. No mediastinal or hilar  lymphadenopathy. No pericardial fluid. Esophagus is normal.  Review lung windows demonstrates no suspicious pulmonary nodules.  IMPRESSION:  1. No evidence of thoracic metastasis.  2. Multinodular thyroid goiter.  CT ABDOMEN AND PELVIS  Findings: There is a 6 mm lesion in the lateral left hepatic lobe  which is unchanged from prior. No new hepatic lesions. The  gallbladder, pancreas, spleen, adrenal glands, and kidneys are  unchanged. There is a small cyst extending from the left kidney  but with is unchanged in size and prior measuring 8 mm.  The stomach, small bowel, appendix, and cecum are normal. Loop of  small bowel extends through a ventral abdominal wall hernia  without evidence obstruction. There is a left lower quadrant  colostomy without evidence obstruction.  Abdominal aorta normal caliber. No retroperitoneal periportal  lymphadenopathy. No mesenteric metastasis. Small central  mesenteric nodes are stable.  In the pelvis, prior proctocolectomy. No evidence of nodularity or  mass. There is thickening in the presacral space unchanged from  prior likely related to the surgery radiation. No pelvic lymph  nodes. Nodular prostate gland indents trigone region of the  bladder  Review of bone windows demonstrates no aggressive osseous lesions.  IMPRESSION:  1. No evidence of metastasis within the abdomen or pelvis.  2. Presacral thickening unchanged from prior likely relates to  prior therapy.  3. Nodular prostate gland indents trigone region  of the bladder.   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 He is status post continuous infusion 5-FU with external radiation therapy from 01/07/2004 through 02/28/2004 that was followed by abdominoperineal resection 03/31/2004 with complete pathological response as well as adjuvant Xeloda from June 2006 through November 2006. CT scan and labs reviewed and showed no evidence of cancer at this time. We will continue active follow up yearly with labs and exams.  We will repeat CT scan as needed.  His next colonoscopy will be due in 09/2012.     [facility], MD 3/25/20144:08 PM

## 2012-03-25 NOTE — Telephone Encounter (Signed)
Gave pt appt for lab and MD March 2015

## 2012-12-03 ENCOUNTER — Telehealth (INDEPENDENT_AMBULATORY_CARE_PROVIDER_SITE_OTHER): Payer: Self-pay | Admitting: *Deleted

## 2012-12-03 NOTE — Telephone Encounter (Signed)
Patient came into office asking for prescription for ostomy bags and supplies.  Spoke to Dr. Daphine Deutscher on the telephone who approved prescription.  Dr. Dwain Sarna urgent office MD signed prescription since Dr. Daphine Deutscher not in office.

## 2013-03-25 ENCOUNTER — Ambulatory Visit (HOSPITAL_BASED_OUTPATIENT_CLINIC_OR_DEPARTMENT_OTHER): Payer: Medicare Other | Admitting: Oncology

## 2013-03-25 ENCOUNTER — Other Ambulatory Visit (HOSPITAL_BASED_OUTPATIENT_CLINIC_OR_DEPARTMENT_OTHER): Payer: Medicare Other

## 2013-03-25 ENCOUNTER — Telehealth: Payer: Self-pay | Admitting: Oncology

## 2013-03-25 ENCOUNTER — Encounter: Payer: Self-pay | Admitting: Oncology

## 2013-03-25 VITALS — BP 122/68 | HR 61 | Temp 97.8°F | Resp 18 | Ht 73.0 in | Wt 241.0 lb

## 2013-03-25 DIAGNOSIS — C20 Malignant neoplasm of rectum: Secondary | ICD-10-CM

## 2013-03-25 LAB — CBC WITH DIFFERENTIAL/PLATELET
BASO%: 0.7 % (ref 0.0–2.0)
BASOS ABS: 0 10*3/uL (ref 0.0–0.1)
EOS ABS: 0.1 10*3/uL (ref 0.0–0.5)
EOS%: 1.9 % (ref 0.0–7.0)
HEMATOCRIT: 43.4 % (ref 38.4–49.9)
HGB: 14.5 g/dL (ref 13.0–17.1)
LYMPH%: 28 % (ref 14.0–49.0)
MCH: 29.6 pg (ref 27.2–33.4)
MCHC: 33.3 g/dL (ref 32.0–36.0)
MCV: 89 fL (ref 79.3–98.0)
MONO#: 0.7 10*3/uL (ref 0.1–0.9)
MONO%: 17.2 % — ABNORMAL HIGH (ref 0.0–14.0)
NEUT#: 2 10*3/uL (ref 1.5–6.5)
NEUT%: 52.2 % (ref 39.0–75.0)
PLATELETS: 177 10*3/uL (ref 140–400)
RBC: 4.88 10*6/uL (ref 4.20–5.82)
RDW: 12.7 % (ref 11.0–14.6)
WBC: 3.9 10*3/uL — AB (ref 4.0–10.3)
lymph#: 1.1 10*3/uL (ref 0.9–3.3)

## 2013-03-25 LAB — COMPREHENSIVE METABOLIC PANEL (CC13)
ALT: 18 U/L (ref 0–55)
ANION GAP: 12 meq/L — AB (ref 3–11)
AST: 19 U/L (ref 5–34)
Albumin: 3.9 g/dL (ref 3.5–5.0)
Alkaline Phosphatase: 37 U/L — ABNORMAL LOW (ref 40–150)
BILIRUBIN TOTAL: 0.86 mg/dL (ref 0.20–1.20)
BUN: 23.4 mg/dL (ref 7.0–26.0)
CO2: 23 meq/L (ref 22–29)
CREATININE: 1.1 mg/dL (ref 0.7–1.3)
Calcium: 9.6 mg/dL (ref 8.4–10.4)
Chloride: 107 mEq/L (ref 98–109)
GLUCOSE: 116 mg/dL (ref 70–140)
Potassium: 4.5 mEq/L (ref 3.5–5.1)
Sodium: 141 mEq/L (ref 136–145)
Total Protein: 7.2 g/dL (ref 6.4–8.3)

## 2013-03-25 NOTE — Progress Notes (Signed)
Hematology and Oncology Follow Up Visit  Chad Key [id number] September 02, 1930 80 y.o. 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  Prior Therapy:  He is status post continuous infusion 5-FU with external radiation therapy from 01/07/2004 through 02/28/2004. He is Status post abdominoperineal resection 03/31/2004 with complete pathological response.  He is Status post adjuvant Xeloda from June 2006 through November 2006. He is S/P Status post Port-A-Cath removal 02/20/2005.   Current therapy: Observation and follow up.   Interim History:  Mr. Chad Key presents today for a follow up visit. He has been doing very well since his last  Visit. He is not reporting any gastrointestinal symptoms. Does not report any nausea or vomiting. He is not fevers, chills or night sweats. No dyspnea or cough. He has normal appetite and has had no problems with  constipation or diarrhea. No dysuria, frequency or hematuria. No alteration in sensation or balance or swelling of extremities. He still very active and performs activity of daily living without any hindrance or decline. Has not reported any recent hospitalizations or illnesses. He is following up with gastroenterology for a possible repeat colonoscopy in the near future.   Medications: I have reviewed the patient's current medications.   Current Outpatient Prescriptions  Medication Sig Dispense Refill  . simvastatin (ZOCOR) 40 MG tablet Take 40 mg by mouth every evening.      . valsartan (DIOVAN) 80 MG tablet Take 80 mg by mouth daily.       No current facility-administered medications for this visit.    Allergies: No Known Allergies  Past Medical History, Surgical history, Social history, and Family History were reviewed and updated.  Review of Systems: Constitutional:  Negative for fever, chills, night sweats, anorexia, weight loss, pain. Cardiovascular:  no chest pain or dyspnea on exertion  Remaining ROS negative. Physical Exam: Blood pressure 122/68, pulse 61, temperature 97.8 F (36.6 C), temperature source Oral, resp. rate 18, height 6\' 1"  (1.854 m), weight 241 lb (109.317 kg), SpO2 98.00%. ECOG: 1 General appearance: alert Head: Normocephalic, without obvious abnormality, atraumatic Neck: no adenopathy, no carotid bruit, no JVD, supple, symmetrical, trachea midline and thyroid not enlarged, symmetric, no tenderness/mass/nodules Lymph nodes: Cervical, supraclavicular, and axillary nodes normal. Heart:regular rate and rhythm, S1, S2 normal, no murmur, click, rub or gallop Lung:chest clear, no wheezing, rales, normal symmetric air entry Abdomin: soft, non-tender, without masses or organomegaly EXT:no erythema, induration, or nodules   Lab Results: Lab Results  Component Value Date   WBC 3.9* 03/25/2013   HGB 14.5 03/25/2013   HCT 43.4 03/25/2013   MCV 89.0 03/25/2013   PLT 177 03/25/2013     Chemistry      Component Value Date/Time   NA 139 03/14/2012 1444   NA 140 03/09/2011 1419   NA 140 03/14/2010 1352   K 4.6 03/14/2012 1444   K 4.3 03/09/2011 1419   K 4.5 03/14/2010 1352   CL 103 03/14/2012 1444   CL 101 03/09/2011 1419   CL 98 03/14/2010 1352   CO2 26 03/14/2012 1444   CO2 27 03/09/2011 1419   CO2 31 03/14/2010 1352   BUN 20.7 03/14/2012 1444   BUN 16 03/09/2011 1419   BUN 18 03/14/2010 1352   CREATININE 1.2 03/14/2012 1444   CREATININE 1.21 03/09/2011 1419   CREATININE 1.2 03/14/2010 1352      Component Value Date/Time   CALCIUM 9.6 03/14/2012 1444  CALCIUM 9.2 03/09/2011 1419   CALCIUM 9.3 03/14/2010 1352   ALKPHOS 39* 03/14/2012 1444   ALKPHOS 35* 03/09/2011 1419   ALKPHOS 42 03/14/2010 1352   AST 17 03/14/2012 1444   AST 21 03/09/2011 1419   AST 21 03/14/2010 1352   ALT 18 03/14/2012 1444   ALT 23 03/09/2011 1419   ALT 24 03/14/2010 1352   BILITOT 0.51 03/14/2012 1444   BILITOT 0.6 03/09/2011 1419   BILITOT 0.50 03/14/2010 1352         Impression and Plan:  80 year old with: 1.  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 He continues to have evidence of disease based on his laboratory data. His last CT scan from 2014 did not show any evidence of any malignancy. I plan to follow him on an annual basis with laboratory data and physical examination and we will repeat imaging studies as needed.  2. Colonoscopy surveillance: He well followup with gastroenterology and in future.    [facility], MD 3/25/20153:05 PM

## 2013-03-25 NOTE — Telephone Encounter (Signed)
PER [facility] [place] APPT FOR 23YR/LABS & DR-SCH FOR 3/28 LABS @ 2:30 APPT @ 3:00/PRINTED PT [facility] [place]

## 2013-03-25 NOTE — Telephone Encounter (Signed)
CORR TO LAST DOC/APPT [facility] FOR 3/23 LAB 2:30 dR

## 2013-03-26 LAB — CEA: CEA: 0.6 ng/mL (ref 0.0–5.0)

## 2014-03-24 ENCOUNTER — Other Ambulatory Visit (HOSPITAL_BASED_OUTPATIENT_CLINIC_OR_DEPARTMENT_OTHER): Payer: Medicare Other

## 2014-03-24 ENCOUNTER — Ambulatory Visit (HOSPITAL_BASED_OUTPATIENT_CLINIC_OR_DEPARTMENT_OTHER): Payer: Medicare Other | Admitting: Oncology

## 2014-03-24 VITALS — BP 110/58 | HR 90 | Temp 97.7°F | Resp 18 | Ht 73.0 in | Wt 237.2 lb

## 2014-03-24 DIAGNOSIS — C20 Malignant neoplasm of rectum: Secondary | ICD-10-CM

## 2014-03-24 DIAGNOSIS — Z85048 Personal history of other malignant neoplasm of rectum, rectosigmoid junction, and anus: Secondary | ICD-10-CM

## 2014-03-24 LAB — CBC WITH DIFFERENTIAL/PLATELET
BASO%: 0.7 % (ref 0.0–2.0)
Basophils Absolute: 0 10e3/uL (ref 0.0–0.1)
EOS%: 2.3 % (ref 0.0–7.0)
Eosinophils Absolute: 0.1 10e3/uL (ref 0.0–0.5)
HCT: 43.8 % (ref 38.4–49.9)
HGB: 14.2 g/dL (ref 13.0–17.1)
LYMPH%: 24.8 % (ref 14.0–49.0)
MCH: 29.1 pg (ref 27.2–33.4)
MCHC: 32.5 g/dL (ref 32.0–36.0)
MCV: 89.5 fL (ref 79.3–98.0)
MONO#: 0.7 10e3/uL (ref 0.1–0.9)
MONO%: 16.2 % — ABNORMAL HIGH (ref 0.0–14.0)
NEUT#: 2.5 10e3/uL (ref 1.5–6.5)
NEUT%: 56 % (ref 39.0–75.0)
Platelets: 199 10e3/uL (ref 140–400)
RBC: 4.89 10e6/uL (ref 4.20–5.82)
RDW: 12.8 % (ref 11.0–14.6)
WBC: 4.5 10e3/uL (ref 4.0–10.3)
lymph#: 1.1 10e3/uL (ref 0.9–3.3)

## 2014-03-24 LAB — COMPREHENSIVE METABOLIC PANEL (CC13)
ALBUMIN: 3.9 g/dL (ref 3.5–5.0)
ALK PHOS: 37 U/L — AB (ref 40–150)
ALT: 20 U/L (ref 0–55)
AST: 17 U/L (ref 5–34)
Anion Gap: 10 mEq/L (ref 3–11)
BUN: 25.1 mg/dL (ref 7.0–26.0)
CALCIUM: 9.1 mg/dL (ref 8.4–10.4)
CHLORIDE: 108 meq/L (ref 98–109)
CO2: 24 mEq/L (ref 22–29)
Creatinine: 1.2 mg/dL (ref 0.7–1.3)
EGFR: 66 mL/min/{1.73_m2} — ABNORMAL LOW (ref >90)
GLUCOSE: 97 mg/dL (ref 70–140)
POTASSIUM: 4.6 meq/L (ref 3.5–5.1)
SODIUM: 142 meq/L (ref 136–145)
TOTAL PROTEIN: 7.3 g/dL (ref 6.4–8.3)
Total Bilirubin: 0.35 mg/dL (ref 0.20–1.20)

## 2014-03-24 NOTE — Progress Notes (Signed)
Hematology and Oncology Follow Up Visit  Amaris Garrette [id number] [birth year] 81 y.o. 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  Prior Therapy:  He is status post continuous infusion 5-FU with external radiation therapy from 01/07/2004 through 02/28/2004. He is Status post abdominoperineal resection 03/31/2004 with complete pathological response.  He is Status post adjuvant Xeloda from June 2006 through November 2006. He is S/P Status post Port-A-Cath removal 02/20/2005.   Current therapy: Observation and follow up.   Interim History:  Mr. Kloepfer presents today for a follow up visit. Since his last visit, he continues to do very well. He reports no new gastrointestinal symptoms. He does not report any nausea or vomiting. He is not fevers, chills or night sweats. No dyspnea or cough. He has normal appetite and has had no problems without constipation or diarrhea. No dysuria, frequency or hematuria. No alteration in sensation or balance or swelling of extremities. He still very active and performs activity of daily living without any hindrance or decline. Has not reported any recent hospitalizations or illnesses. He reports no headaches or blurry vision or change in his mentation. The remaining review of systems unremarkable.   Medications: I have reviewed the patient's current medications.   Current Outpatient Prescriptions  Medication Sig Dispense Refill  . lisinopril-hydrochlorothiazide (PRINZIDE,ZESTORETIC) 20-25 MG per tablet Take 1 tablet by mouth daily.  1  . simvastatin (ZOCOR) 40 MG tablet Take 40 mg by mouth every evening.     No current facility-administered medications for this visit.    Allergies: No Known Allergies  Past Medical History, Surgical history, Social history, and Family History were reviewed and updated.    Physical Exam: Blood pressure 110/58, pulse 90,  temperature 97.7 F (36.5 C), temperature source Oral, resp. rate 18, height 6\' 1"  (1.854 m), weight 237 lb 3.2 oz (107.593 kg), SpO2 96 %. ECOG: 1 General appearance: alert Head: Normocephalic, without obvious abnormality, atraumatic Neck: no adenopathy Lymph nodes: Cervical, supraclavicular, and axillary nodes normal. Heart:regular rate and rhythm, S1, S2 normal, no murmur, click, rub or gallop Lung:chest clear, no wheezing, rales, normal symmetric air entry. Abdomin: soft, non-tender, without masses or organomegaly EXT:no erythema, induration, or nodules   Lab Results: Lab Results  Component Value Date   WBC 4.5 03/24/2014   HGB 14.2 03/24/2014   HCT 43.8 03/24/2014   MCV 89.5 03/24/2014   PLT 199 03/24/2014     Chemistry      Component Value Date/Time   NA 141 03/25/2013 1446   NA 140 03/09/2011 1419   NA 140 03/14/2010 1352   K 4.5 03/25/2013 1446   K 4.3 03/09/2011 1419   K 4.5 03/14/2010 1352   CL 103 03/14/2012 1444   CL 101 03/09/2011 1419   CL 98 03/14/2010 1352   CO2 23 03/25/2013 1446   CO2 27 03/09/2011 1419   CO2 31 03/14/2010 1352   BUN 23.4 03/25/2013 1446   BUN 16 03/09/2011 1419   BUN 18 03/14/2010 1352   CREATININE 1.1 03/25/2013 1446   CREATININE 1.21 03/09/2011 1419   CREATININE 1.2 03/14/2010 1352      Component Value Date/Time   CALCIUM 9.6 03/25/2013 1446   CALCIUM 9.2 03/09/2011 1419   CALCIUM 9.3 03/14/2010 1352   ALKPHOS 37* 03/25/2013 1446   ALKPHOS 35* 03/09/2011 1419   ALKPHOS 42 03/14/2010 1352   AST 19 03/25/2013 1446   AST 21  03/09/2011 1419   AST 21 03/14/2010 1352   ALT 18 03/25/2013 1446   ALT 23 03/09/2011 1419   ALT 24 03/14/2010 1352   BILITOT 0.86 03/25/2013 1446   BILITOT 0.6 03/09/2011 1419   BILITOT 0.50 03/14/2010 1352        Impression and Plan:  81 year old with: 1.  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 He is status post continuous infusion 5-FU with external  radiation therapy from 01/07/2004 through 02/28/2004 that was followed by abdominoperineal resection 03/31/2004 with complete pathological response as well as adjuvant Xeloda from June 2006 through November 2006.   He continues to have evidence of disease based on his laboratory data. His last CT scan from 2014 did not show any evidence of any malignancy. He is close to 10 years out from his cancer without any evidence of recurrence. I see no need for any further oncology follow-up unless there is any issues in the future.  2. Colonoscopy surveillance: His last colonoscopy was in 2015 and I urged him to continue to have that done routinely.  Follow-up will be as needed in the future.    [facility], MD 03/24/2014 2:57 PM

## 2014-03-25 LAB — CEA: CEA: 0.9 ng/mL (ref 0.0–5.0)

## 2015-03-08 DIAGNOSIS — E669 Obesity, unspecified: Secondary | ICD-10-CM | POA: Diagnosis not present

## 2015-03-08 DIAGNOSIS — Z933 Colostomy status: Secondary | ICD-10-CM | POA: Diagnosis not present

## 2015-03-08 DIAGNOSIS — I10 Essential (primary) hypertension: Secondary | ICD-10-CM | POA: Diagnosis not present

## 2015-03-08 DIAGNOSIS — E7800 Pure hypercholesterolemia, unspecified: Secondary | ICD-10-CM | POA: Diagnosis not present

## 2015-03-08 DIAGNOSIS — R0602 Shortness of breath: Secondary | ICD-10-CM | POA: Diagnosis not present

## 2015-03-08 DIAGNOSIS — R7301 Impaired fasting glucose: Secondary | ICD-10-CM | POA: Diagnosis not present

## 2015-03-08 DIAGNOSIS — Z6830 Body mass index (BMI) 30.0-30.9, adult: Secondary | ICD-10-CM | POA: Diagnosis not present

## 2015-06-06 DIAGNOSIS — Z433 Encounter for attention to colostomy: Secondary | ICD-10-CM | POA: Diagnosis not present

## 2015-06-06 DIAGNOSIS — Z933 Colostomy status: Secondary | ICD-10-CM | POA: Diagnosis not present

## 2015-07-28 DIAGNOSIS — Z6830 Body mass index (BMI) 30.0-30.9, adult: Secondary | ICD-10-CM | POA: Diagnosis not present

## 2015-07-28 DIAGNOSIS — E669 Obesity, unspecified: Secondary | ICD-10-CM | POA: Diagnosis not present

## 2015-07-28 DIAGNOSIS — J019 Acute sinusitis, unspecified: Secondary | ICD-10-CM | POA: Diagnosis not present

## 2015-07-28 DIAGNOSIS — R109 Unspecified abdominal pain: Secondary | ICD-10-CM | POA: Diagnosis not present

## 2015-09-09 DIAGNOSIS — Z1389 Encounter for screening for other disorder: Secondary | ICD-10-CM | POA: Diagnosis not present

## 2015-09-09 DIAGNOSIS — E7800 Pure hypercholesterolemia, unspecified: Secondary | ICD-10-CM | POA: Diagnosis not present

## 2015-09-09 DIAGNOSIS — Z6830 Body mass index (BMI) 30.0-30.9, adult: Secondary | ICD-10-CM | POA: Diagnosis not present

## 2015-09-09 DIAGNOSIS — Z0000 Encounter for general adult medical examination without abnormal findings: Secondary | ICD-10-CM | POA: Diagnosis not present

## 2015-09-09 DIAGNOSIS — Z9181 History of falling: Secondary | ICD-10-CM | POA: Diagnosis not present

## 2015-09-09 DIAGNOSIS — Z933 Colostomy status: Secondary | ICD-10-CM | POA: Diagnosis not present

## 2015-09-09 DIAGNOSIS — I10 Essential (primary) hypertension: Secondary | ICD-10-CM | POA: Diagnosis not present

## 2015-11-02 DIAGNOSIS — Z23 Encounter for immunization: Secondary | ICD-10-CM | POA: Diagnosis not present

## 2015-11-19 DIAGNOSIS — Z933 Colostomy status: Secondary | ICD-10-CM | POA: Diagnosis not present

## 2015-11-19 DIAGNOSIS — Z433 Encounter for attention to colostomy: Secondary | ICD-10-CM | POA: Diagnosis not present

## 2016-03-08 DIAGNOSIS — R7303 Prediabetes: Secondary | ICD-10-CM | POA: Diagnosis not present

## 2016-03-08 DIAGNOSIS — I10 Essential (primary) hypertension: Secondary | ICD-10-CM | POA: Diagnosis not present

## 2016-03-08 DIAGNOSIS — E7800 Pure hypercholesterolemia, unspecified: Secondary | ICD-10-CM | POA: Diagnosis not present

## 2016-03-08 DIAGNOSIS — Z933 Colostomy status: Secondary | ICD-10-CM | POA: Diagnosis not present

## 2016-05-23 DIAGNOSIS — Z933 Colostomy status: Secondary | ICD-10-CM | POA: Diagnosis not present

## 2016-05-23 DIAGNOSIS — Z433 Encounter for attention to colostomy: Secondary | ICD-10-CM | POA: Diagnosis not present

## 2016-07-31 DIAGNOSIS — I10 Essential (primary) hypertension: Secondary | ICD-10-CM | POA: Diagnosis not present

## 2016-07-31 DIAGNOSIS — Z85048 Personal history of other malignant neoplasm of rectum, rectosigmoid junction, and anus: Secondary | ICD-10-CM | POA: Insufficient documentation

## 2016-07-31 DIAGNOSIS — R109 Unspecified abdominal pain: Secondary | ICD-10-CM | POA: Insufficient documentation

## 2016-07-31 DIAGNOSIS — Z79899 Other long term (current) drug therapy: Secondary | ICD-10-CM | POA: Diagnosis not present

## 2016-07-31 DIAGNOSIS — N289 Disorder of kidney and ureter, unspecified: Secondary | ICD-10-CM | POA: Diagnosis not present

## 2016-07-31 LAB — CBC
HEMATOCRIT: 45 % (ref 39.0–52.0)
Hemoglobin: 14.8 g/dL (ref 13.0–17.0)
MCH: 29.5 pg (ref 26.0–34.0)
MCHC: 32.9 g/dL (ref 30.0–36.0)
MCV: 89.6 fL (ref 78.0–100.0)
Platelets: 198 10*3/uL (ref 150–400)
RBC: 5.02 MIL/uL (ref 4.22–5.81)
RDW: 12.8 % (ref 11.5–15.5)
WBC: 4.2 10*3/uL (ref 4.0–10.5)

## 2016-07-31 LAB — URINALYSIS, ROUTINE W REFLEX MICROSCOPIC
BILIRUBIN URINE: NEGATIVE
GLUCOSE, UA: NEGATIVE mg/dL
HGB URINE DIPSTICK: NEGATIVE
Ketones, ur: NEGATIVE mg/dL
Leukocytes, UA: NEGATIVE
Nitrite: NEGATIVE
PH: 6 (ref 5.0–8.0)
Protein, ur: NEGATIVE mg/dL
SPECIFIC GRAVITY, URINE: 1.019 (ref 1.005–1.030)

## 2016-07-31 LAB — BASIC METABOLIC PANEL
Anion gap: 8 (ref 5–15)
BUN: 36 mg/dL — AB (ref 6–20)
CALCIUM: 9.3 mg/dL (ref 8.9–10.3)
CO2: 27 mmol/L (ref 22–32)
CREATININE: 1.85 mg/dL — AB (ref 0.61–1.24)
Chloride: 102 mmol/L (ref 101–111)
GFR calc non Af Amer: 32 mL/min — ABNORMAL LOW (ref >60)
GFR, EST AFRICAN AMERICAN: 37 mL/min — AB (ref >60)
GLUCOSE: 102 mg/dL — AB (ref 65–99)
Potassium: 5.7 mmol/L — ABNORMAL HIGH (ref 3.5–5.1)
Sodium: 137 mmol/L (ref 135–145)

## 2016-07-31 NOTE — ED Notes (Signed)
Patient updated on wait time 

## 2016-07-31 NOTE — ED Triage Notes (Addendum)
Pt endorses right sided flank pain x 5-6 days. Pt sts pain has intermittent pain throughout the past few days. Pt states pain is worse when he sits up or leans on side but feels better with tylenol or leaning on opposite side. Pt sts he has been working on a car and been laying on the ground to wokr under the car more. Pt endorses increased urinary frequency. Pt denies n/v/d/dysuria.

## 2016-08-01 ENCOUNTER — Emergency Department (HOSPITAL_COMMUNITY): Payer: Medicare Other

## 2016-08-01 ENCOUNTER — Emergency Department (HOSPITAL_COMMUNITY)
Admission: EM | Admit: 2016-08-01 | Discharge: 2016-08-01 | Disposition: A | Discharge status: Home / Self Care | Payer: Medicare Other | Attending: Emergency Medicine | Admitting: Emergency Medicine

## 2016-08-01 DIAGNOSIS — R109 Unspecified abdominal pain: Secondary | ICD-10-CM

## 2016-08-01 DIAGNOSIS — N289 Disorder of kidney and ureter, unspecified: Secondary | ICD-10-CM

## 2016-08-01 MED ORDER — HYDROCHLOROTHIAZIDE 25 MG PO TABS: 25.0000 mg | ORAL_TABLET | Freq: Every day | ORAL | 0 refills | Status: AC

## 2016-08-01 MED ORDER — SODIUM CHLORIDE 0.9 % IV BOLUS (SEPSIS)
1000.0000 mL | Freq: Once | INTRAVENOUS | Status: AC
  Administered 2016-08-01: 1000 mL via INTRAVENOUS

## 2016-08-01 MED ORDER — HYDROCODONE-ACETAMINOPHEN 5-325 MG PO TABS: 1.0000 | ORAL_TABLET | Freq: Four times a day (QID) | ORAL | 0 refills | Status: AC | PRN

## 2016-08-01 MED ORDER — FENTANYL CITRATE (PF) 100 MCG/2ML IJ SOLN
25.0000 ug | Freq: Once | INTRAMUSCULAR | Status: AC
  Administered 2016-08-01: 25 ug via INTRAVENOUS
  Filled 2016-08-01: qty 2

## 2016-08-01 NOTE — ED Notes (Signed)
Pt ambulatory to restroom. Pt denies pain.

## 2016-08-01 NOTE — ED Provider Notes (Signed)
[facility] DEPT Provider Note   CSN: [id number] Arrival date & time: 07/31/16  1703     History   Chief Complaint Chief Complaint  Patient presents with  . Flank Pain    HPI Azlaan Isidore is a 83 y.o. male.  The history is provided by the patient.  Flank Pain  This is a new problem. The current episode started more than 2 days ago. The problem occurs daily. The problem has been gradually worsening. Pertinent negatives include no chest pain, no abdominal pain and no shortness of breath. Exacerbated by: certain positions. The symptoms are relieved by rest. He has tried acetaminophen for the symptoms. The treatment provided no relief.  pt reports onset of right side/flank pain about 4 days ago No trauma/fall, but he is very active and may have strained his back No fever/vomiting No leg weakness No abd pain He reports urinary frequency but no pain He has h/o colostomy placement No cp/sob No other acute complaints It is worse with movement and certain positions   Past Medical History:  Diagnosis Date  . Hypertension   . Rectal cancer    rectal ca dx 11/05    Patient Active Problem List   Diagnosis Date Noted  . Malignant neoplasm of rectum ([place]) 03/23/2011    No past surgical history on file.     Home Medications    Prior to Admission medications   Medication Sig Start Date End Date Taking? Authorizing Provider  lisinopril-hydrochlorothiazide (PRINZIDE,ZESTORETIC) 20-25 MG per tablet Take 1 tablet by mouth daily. 03/01/14  Yes [provider]  simvastatin (ZOCOR) 40 MG tablet Take 40 mg by mouth every evening.   Yes [provider]    Family History No family history on file.  Social History Social History  Substance Use Topics  . Smoking status: Not on file  . Smokeless tobacco: Not on file  . Alcohol use Not on file     Allergies   Patient has no known allergies.   Review of Systems Review of Systems  Respiratory:  Negative for shortness of breath.   Cardiovascular: Negative for chest pain.  Gastrointestinal: Negative for abdominal pain.  Genitourinary: Positive for flank pain.  All other systems reviewed and are negative.    Physical Exam Updated Vital Signs BP (!) 120/58 (BP Location: Right Arm)   Pulse 60   Temp 98.1 F (36.7 C) (Oral)   Resp 18   SpO2 97%   Physical Exam CONSTITUTIONAL: Well developed/well nourished, elderly but well appearing for age HEAD: Normocephalic/atraumatic EYES: EOMI/PERRL ENMT: Mucous membranes moist NECK: supple no meningeal signs SPINE/BACK:entire spine nontender, lumbar paraspinal tenderness CV: S1/S2 noted, no murmurs/rubs/gallops noted LUNGS: Lungs are clear to auscultation bilaterally, no apparent distress ABDOMEN: soft, nontender, no rebound or guarding,colostomy noted GU:no cva tenderness NEURO: Pt is awake/alert/appropriate, moves all extremitiesx4.  No facial droop.  No focal weakness in the lower extremities EXTREMITIES: pulses normal/equal, full ROM SKIN: warm, color normal PSYCH: no abnormalities of mood noted, alert and oriented to situation   ED Treatments / Results  Labs (all labs ordered are listed, but only abnormal results are displayed) Labs Reviewed  BASIC METABOLIC PANEL - Abnormal; Notable for the following:       Result Value   Potassium 5.7 (*)    Glucose, Bld 102 (*)    BUN 36 (*)    Creatinine, Ser 1.85 (*)    GFR calc non Af Amer 32 (*)    GFR calc Af Wyvonnia Lora  37 (*)    All other components within normal limits  URINE CULTURE  URINALYSIS, ROUTINE W REFLEX MICROSCOPIC  CBC    EKG  EKG Interpretation None       Radiology Ct Renal Stone Study  Result Date: 08/01/2016 CLINICAL DATA:  Initial evaluation for acute right flank pain. EXAM: CT ABDOMEN AND PELVIS WITHOUT CONTRAST TECHNIQUE: Multidetector CT imaging of the abdomen and pelvis was performed following the standard protocol without IV contrast. COMPARISON:   Prior CT from 03/26/2011. FINDINGS: Lower chest: Visualized lung bases are clear. Hepatobiliary: The liver demonstrates a normal unenhanced appearance. Gallbladder within normal limits. No biliary dilatation. Pancreas: Pancreas within normal limits. Spleen: Subcentimeter hypodensity noted within the central aspect of the spleen, indeterminate, but of doubtful significance. Spleen otherwise unremarkable. Adrenals/Urinary Tract: Adrenal glands within normal limits. Kidneys equal in size without evidence for nephrolithiasis or hydronephrosis. No radiopaque calculi seen along the course of either renal collecting system. 19 mm in exophytic cyst noted extending from the lower pole the left kidney. Few additional subcentimeter hypodensities too small the characterize, but statistically likely reflects small cysts as well. No hydroureter. Bladder demonstrates no acute abnormality. No layering stones within the bladder lumen. Small bladder diverticulum noted at the right posterior bladder wall. Stomach/Bowel: Small hiatal hernia noted. Stomach otherwise unremarkable. The left lower quadrant ostomy in place. No evidence for obstruction. Additional paraumbilical hernia containing fat and loop of bowel noted without associated obstruction or inflammatory changes (series 3, image 54). Neck of the hernia measures 5.5 cm. No acute inflammatory changes seen about the bowels. Vascular/Lymphatic: Moderate aorto bi-iliac atherosclerotic disease. No aneurysm. No pathologically enlarged intra-abdominal or pelvic lymph nodes identified. Reproductive: Prostate mildly enlarged with median lobe hypertrophy, stable. Other: Soft tissue stranding within the presacral space, likely post treatment related, stable from previous. No free air or fluid. Musculoskeletal: No acute osseus abnormality. No worrisome lytic or blastic osseous lesions. IMPRESSION: 1. No CT evidence for nephrolithiasis or obstructive uropathy. 2. Fat and bowel containing  paraumbilical hernia without associated obstruction or inflammation. 3. Left lower quadrant ostomy without complication. 4. Stable presacral soft tissue density, likely rib reflecting post treatment changes. 5. Aorto bi-iliac atherosclerosis. Electronically Signed   By: Jeannine Boga M.D.   On: 08/01/2016 01:54    Procedures Procedures (including critical care time)  Medications Ordered in ED Medications  sodium chloride 0.9 % bolus 1,000 mL (1,000 mLs Intravenous New Bag/Given 08/01/16 0141)  fentaNYL (SUBLIMAZE) injection 25 mcg (25 mcg Intravenous Given 08/01/16 0143)     Initial Impression / Assessment and Plan / ED Course  I have reviewed the triage vital signs and the nursing notes.  Pertinent labs  results that were available during my care of the patient were reviewed by me and considered in my medical decision making (see chart for details).     1:31 AM Will need to hold lisinopril due to renal insufficiency/HyperK  2:41 AM Pain improved Pt stable CT imaging without acute findings No acute neuro deficits to suggest spinal myelopathy He feels well for d/c home Short course of vicodin ordered Due to renal insufficiency, mild hyperK, will STOP lisinopril and only take HCTZ for a week and close PCP followup This was d/w patient/son and they agree with plan  Final Clinical Impressions(s) / ED Diagnoses   Final diagnoses:  Flank pain  Renal insufficiency    New Prescriptions New Prescriptions   HYDROCHLOROTHIAZIDE (HYDRODIURIL) 25 MG TABLET    Take 1 tablet (25 mg total)  by mouth daily.   HYDROCODONE-ACETAMINOPHEN (NORCO/VICODIN) 5-325 MG TABLET    Take 1 tablet by mouth every 6 (six) hours as needed.     Ripley Fraise, MD 08/01/16 [phone]

## 2016-08-01 NOTE — Discharge Instructions (Signed)

## 2016-08-01 NOTE — ED Notes (Signed)
Pt family requesting update, apologized for delay.

## 2016-08-01 NOTE — ED Notes (Signed)
No signature pad available. Pt verbalizes understanding of d/c paperwork and has received prescription.

## 2016-08-01 NOTE — ED Notes (Signed)
ED Provider at bedside. 

## 2016-08-01 NOTE — ED Notes (Signed)
Patient transported to CT 

## 2016-08-02 LAB — URINE CULTURE

## 2016-09-13 DIAGNOSIS — I10 Essential (primary) hypertension: Secondary | ICD-10-CM | POA: Diagnosis not present

## 2016-09-13 DIAGNOSIS — R7303 Prediabetes: Secondary | ICD-10-CM | POA: Diagnosis not present

## 2016-09-13 DIAGNOSIS — Z6832 Body mass index (BMI) 32.0-32.9, adult: Secondary | ICD-10-CM | POA: Diagnosis not present

## 2016-09-13 DIAGNOSIS — E7800 Pure hypercholesterolemia, unspecified: Secondary | ICD-10-CM | POA: Diagnosis not present

## 2016-10-29 DIAGNOSIS — Z23 Encounter for immunization: Secondary | ICD-10-CM | POA: Diagnosis not present

## 2016-10-30 DIAGNOSIS — Z433 Encounter for attention to colostomy: Secondary | ICD-10-CM | POA: Diagnosis not present

## 2016-10-30 DIAGNOSIS — Z933 Colostomy status: Secondary | ICD-10-CM | POA: Diagnosis not present

## 2017-03-13 DIAGNOSIS — I10 Essential (primary) hypertension: Secondary | ICD-10-CM | POA: Diagnosis not present

## 2017-03-13 DIAGNOSIS — R7303 Prediabetes: Secondary | ICD-10-CM | POA: Diagnosis not present

## 2017-03-13 DIAGNOSIS — E7800 Pure hypercholesterolemia, unspecified: Secondary | ICD-10-CM | POA: Diagnosis not present

## 2017-04-24 DIAGNOSIS — Z933 Colostomy status: Secondary | ICD-10-CM | POA: Diagnosis not present

## 2017-04-24 DIAGNOSIS — Z433 Encounter for attention to colostomy: Secondary | ICD-10-CM | POA: Diagnosis not present

## 2017-09-20 DIAGNOSIS — Z23 Encounter for immunization: Secondary | ICD-10-CM | POA: Diagnosis not present

## 2017-09-20 DIAGNOSIS — Z139 Encounter for screening, unspecified: Secondary | ICD-10-CM | POA: Diagnosis not present

## 2017-09-20 DIAGNOSIS — Z9181 History of falling: Secondary | ICD-10-CM | POA: Diagnosis not present

## 2017-09-20 DIAGNOSIS — Z0000 Encounter for general adult medical examination without abnormal findings: Secondary | ICD-10-CM | POA: Diagnosis not present

## 2017-09-20 DIAGNOSIS — E119 Type 2 diabetes mellitus without complications: Secondary | ICD-10-CM | POA: Diagnosis not present

## 2017-09-20 DIAGNOSIS — Z125 Encounter for screening for malignant neoplasm of prostate: Secondary | ICD-10-CM | POA: Diagnosis not present

## 2017-09-20 DIAGNOSIS — Z1339 Encounter for screening examination for other mental health and behavioral disorders: Secondary | ICD-10-CM | POA: Diagnosis not present

## 2017-10-23 DIAGNOSIS — Z433 Encounter for attention to colostomy: Secondary | ICD-10-CM | POA: Diagnosis not present

## 2017-10-23 DIAGNOSIS — Z933 Colostomy status: Secondary | ICD-10-CM | POA: Diagnosis not present

## 2018-02-18 DIAGNOSIS — H26493 Other secondary cataract, bilateral: Secondary | ICD-10-CM | POA: Diagnosis not present

## 2018-03-21 DIAGNOSIS — E7800 Pure hypercholesterolemia, unspecified: Secondary | ICD-10-CM | POA: Diagnosis not present

## 2018-03-21 DIAGNOSIS — N183 Chronic kidney disease, stage 3 (moderate): Secondary | ICD-10-CM | POA: Diagnosis not present

## 2018-03-21 DIAGNOSIS — Z6832 Body mass index (BMI) 32.0-32.9, adult: Secondary | ICD-10-CM | POA: Diagnosis not present

## 2018-03-21 DIAGNOSIS — E119 Type 2 diabetes mellitus without complications: Secondary | ICD-10-CM | POA: Diagnosis not present

## 2018-04-11 DIAGNOSIS — Z933 Colostomy status: Secondary | ICD-10-CM | POA: Diagnosis not present

## 2018-04-11 DIAGNOSIS — Z433 Encounter for attention to colostomy: Secondary | ICD-10-CM | POA: Diagnosis not present

## 2018-06-09 DIAGNOSIS — U071 COVID-19: Secondary | ICD-10-CM | POA: Diagnosis not present

## 2018-06-09 DIAGNOSIS — F0390 Unspecified dementia without behavioral disturbance: Secondary | ICD-10-CM | POA: Diagnosis not present

## 2018-06-09 DIAGNOSIS — M791 Myalgia, unspecified site: Secondary | ICD-10-CM | POA: Diagnosis not present

## 2018-06-09 DIAGNOSIS — R05 Cough: Secondary | ICD-10-CM | POA: Diagnosis not present

## 2018-06-09 DIAGNOSIS — Z85048 Personal history of other malignant neoplasm of rectum, rectosigmoid junction, and anus: Secondary | ICD-10-CM | POA: Diagnosis not present

## 2018-06-09 DIAGNOSIS — R531 Weakness: Secondary | ICD-10-CM | POA: Diagnosis not present

## 2018-06-09 DIAGNOSIS — R2681 Unsteadiness on feet: Secondary | ICD-10-CM | POA: Diagnosis not present

## 2018-06-09 DIAGNOSIS — E1122 Type 2 diabetes mellitus with diabetic chronic kidney disease: Secondary | ICD-10-CM | POA: Diagnosis not present

## 2018-06-09 DIAGNOSIS — E7800 Pure hypercholesterolemia, unspecified: Secondary | ICD-10-CM | POA: Diagnosis not present

## 2018-06-09 DIAGNOSIS — N401 Enlarged prostate with lower urinary tract symptoms: Secondary | ICD-10-CM | POA: Diagnosis not present

## 2018-06-09 DIAGNOSIS — I4510 Unspecified right bundle-branch block: Secondary | ICD-10-CM | POA: Diagnosis not present

## 2018-06-09 DIAGNOSIS — J988 Other specified respiratory disorders: Secondary | ICD-10-CM | POA: Diagnosis not present

## 2018-06-09 DIAGNOSIS — N183 Chronic kidney disease, stage 3 (moderate): Secondary | ICD-10-CM | POA: Diagnosis not present

## 2018-06-09 DIAGNOSIS — R0902 Hypoxemia: Secondary | ICD-10-CM | POA: Diagnosis not present

## 2018-06-09 DIAGNOSIS — I129 Hypertensive chronic kidney disease with stage 1 through stage 4 chronic kidney disease, or unspecified chronic kidney disease: Secondary | ICD-10-CM | POA: Diagnosis not present

## 2018-06-09 DIAGNOSIS — Z209 Contact with and (suspected) exposure to unspecified communicable disease: Secondary | ICD-10-CM | POA: Diagnosis not present

## 2018-06-09 DIAGNOSIS — Z85038 Personal history of other malignant neoplasm of large intestine: Secondary | ICD-10-CM | POA: Diagnosis not present

## 2018-06-09 DIAGNOSIS — D696 Thrombocytopenia, unspecified: Secondary | ICD-10-CM | POA: Diagnosis not present

## 2018-06-09 DIAGNOSIS — R338 Other retention of urine: Secondary | ICD-10-CM | POA: Diagnosis not present

## 2018-06-18 DIAGNOSIS — U071 COVID-19: Secondary | ICD-10-CM | POA: Diagnosis not present

## 2018-06-18 DIAGNOSIS — R339 Retention of urine, unspecified: Secondary | ICD-10-CM | POA: Diagnosis not present

## 2018-06-18 DIAGNOSIS — E119 Type 2 diabetes mellitus without complications: Secondary | ICD-10-CM | POA: Diagnosis not present

## 2018-06-18 DIAGNOSIS — Z79899 Other long term (current) drug therapy: Secondary | ICD-10-CM | POA: Diagnosis not present

## 2018-06-18 DIAGNOSIS — R531 Weakness: Secondary | ICD-10-CM | POA: Diagnosis not present

## 2018-06-22 DIAGNOSIS — R531 Weakness: Secondary | ICD-10-CM | POA: Diagnosis not present

## 2018-06-22 DIAGNOSIS — Z79899 Other long term (current) drug therapy: Secondary | ICD-10-CM | POA: Diagnosis not present

## 2018-06-22 DIAGNOSIS — E119 Type 2 diabetes mellitus without complications: Secondary | ICD-10-CM | POA: Diagnosis not present

## 2018-06-22 DIAGNOSIS — Z466 Encounter for fitting and adjustment of urinary device: Secondary | ICD-10-CM | POA: Diagnosis not present

## 2018-06-22 DIAGNOSIS — R262 Difficulty in walking, not elsewhere classified: Secondary | ICD-10-CM | POA: Diagnosis not present

## 2018-06-22 DIAGNOSIS — Z8619 Personal history of other infectious and parasitic diseases: Secondary | ICD-10-CM | POA: Diagnosis not present

## 2018-06-22 DIAGNOSIS — E7800 Pure hypercholesterolemia, unspecified: Secondary | ICD-10-CM | POA: Diagnosis not present

## 2018-06-22 DIAGNOSIS — R5383 Other fatigue: Secondary | ICD-10-CM | POA: Diagnosis not present

## 2018-06-22 DIAGNOSIS — Z85038 Personal history of other malignant neoplasm of large intestine: Secondary | ICD-10-CM | POA: Diagnosis not present

## 2018-06-22 DIAGNOSIS — I10 Essential (primary) hypertension: Secondary | ICD-10-CM | POA: Diagnosis not present

## 2018-06-22 DIAGNOSIS — R339 Retention of urine, unspecified: Secondary | ICD-10-CM | POA: Diagnosis not present

## 2018-06-22 DIAGNOSIS — U071 COVID-19: Secondary | ICD-10-CM | POA: Diagnosis not present

## 2018-06-22 DIAGNOSIS — K429 Umbilical hernia without obstruction or gangrene: Secondary | ICD-10-CM | POA: Diagnosis not present

## 2018-06-22 DIAGNOSIS — Z87891 Personal history of nicotine dependence: Secondary | ICD-10-CM | POA: Diagnosis not present

## 2018-07-02 DIAGNOSIS — U071 COVID-19: Secondary | ICD-10-CM | POA: Diagnosis not present

## 2018-07-02 DIAGNOSIS — I10 Essential (primary) hypertension: Secondary | ICD-10-CM | POA: Diagnosis not present

## 2018-07-02 DIAGNOSIS — Z6829 Body mass index (BMI) 29.0-29.9, adult: Secondary | ICD-10-CM | POA: Diagnosis not present

## 2018-07-02 DIAGNOSIS — R339 Retention of urine, unspecified: Secondary | ICD-10-CM | POA: Diagnosis not present

## 2018-07-07 DIAGNOSIS — R339 Retention of urine, unspecified: Secondary | ICD-10-CM | POA: Diagnosis not present

## 2018-07-24 DIAGNOSIS — R262 Difficulty in walking, not elsewhere classified: Secondary | ICD-10-CM | POA: Diagnosis not present

## 2018-07-24 DIAGNOSIS — R5383 Other fatigue: Secondary | ICD-10-CM | POA: Diagnosis not present

## 2018-07-24 DIAGNOSIS — Z85038 Personal history of other malignant neoplasm of large intestine: Secondary | ICD-10-CM | POA: Diagnosis not present

## 2018-07-24 DIAGNOSIS — I10 Essential (primary) hypertension: Secondary | ICD-10-CM | POA: Diagnosis not present

## 2018-07-24 DIAGNOSIS — K429 Umbilical hernia without obstruction or gangrene: Secondary | ICD-10-CM | POA: Diagnosis not present

## 2018-07-24 DIAGNOSIS — E119 Type 2 diabetes mellitus without complications: Secondary | ICD-10-CM | POA: Diagnosis not present

## 2018-07-24 DIAGNOSIS — Z87891 Personal history of nicotine dependence: Secondary | ICD-10-CM | POA: Diagnosis not present

## 2018-07-24 DIAGNOSIS — R339 Retention of urine, unspecified: Secondary | ICD-10-CM | POA: Diagnosis not present

## 2018-07-24 DIAGNOSIS — R531 Weakness: Secondary | ICD-10-CM | POA: Diagnosis not present

## 2018-07-24 DIAGNOSIS — E7800 Pure hypercholesterolemia, unspecified: Secondary | ICD-10-CM | POA: Diagnosis not present

## 2018-07-24 DIAGNOSIS — Z79899 Other long term (current) drug therapy: Secondary | ICD-10-CM | POA: Diagnosis not present

## 2018-07-24 DIAGNOSIS — Z8619 Personal history of other infectious and parasitic diseases: Secondary | ICD-10-CM | POA: Diagnosis not present

## 2018-07-24 DIAGNOSIS — U071 COVID-19: Secondary | ICD-10-CM | POA: Diagnosis not present

## 2018-07-24 DIAGNOSIS — Z466 Encounter for fitting and adjustment of urinary device: Secondary | ICD-10-CM | POA: Diagnosis not present

## 2018-09-03 DIAGNOSIS — Z933 Colostomy status: Secondary | ICD-10-CM | POA: Diagnosis not present

## 2018-09-03 DIAGNOSIS — Z433 Encounter for attention to colostomy: Secondary | ICD-10-CM | POA: Diagnosis not present

## 2018-09-22 DIAGNOSIS — Z0000 Encounter for general adult medical examination without abnormal findings: Secondary | ICD-10-CM | POA: Diagnosis not present

## 2018-09-22 DIAGNOSIS — E785 Hyperlipidemia, unspecified: Secondary | ICD-10-CM | POA: Diagnosis not present

## 2018-09-22 DIAGNOSIS — Z9181 History of falling: Secondary | ICD-10-CM | POA: Diagnosis not present

## 2018-09-22 DIAGNOSIS — Z1231 Encounter for screening mammogram for malignant neoplasm of breast: Secondary | ICD-10-CM | POA: Diagnosis not present

## 2018-09-23 DIAGNOSIS — Z933 Colostomy status: Secondary | ICD-10-CM | POA: Diagnosis not present

## 2018-09-23 DIAGNOSIS — E7800 Pure hypercholesterolemia, unspecified: Secondary | ICD-10-CM | POA: Diagnosis not present

## 2018-09-23 DIAGNOSIS — E119 Type 2 diabetes mellitus without complications: Secondary | ICD-10-CM | POA: Diagnosis not present

## 2018-09-23 DIAGNOSIS — I10 Essential (primary) hypertension: Secondary | ICD-10-CM | POA: Diagnosis not present

## 2019-02-23 DIAGNOSIS — H5203 Hypermetropia, bilateral: Secondary | ICD-10-CM | POA: Diagnosis not present

## 2019-03-26 DIAGNOSIS — I10 Essential (primary) hypertension: Secondary | ICD-10-CM | POA: Diagnosis not present

## 2019-03-26 DIAGNOSIS — E119 Type 2 diabetes mellitus without complications: Secondary | ICD-10-CM | POA: Diagnosis not present

## 2019-03-26 DIAGNOSIS — N1831 Chronic kidney disease, stage 3a: Secondary | ICD-10-CM | POA: Diagnosis not present

## 2019-03-26 DIAGNOSIS — E7800 Pure hypercholesterolemia, unspecified: Secondary | ICD-10-CM | POA: Diagnosis not present

## 2019-04-28 DIAGNOSIS — Z933 Colostomy status: Secondary | ICD-10-CM | POA: Diagnosis not present

## 2019-04-28 DIAGNOSIS — Z433 Encounter for attention to colostomy: Secondary | ICD-10-CM | POA: Diagnosis not present

## 2020-06-07 DIAGNOSIS — Z933 Colostomy status: Secondary | ICD-10-CM | POA: Diagnosis not present

## 2020-06-07 DIAGNOSIS — Z433 Encounter for attention to colostomy: Secondary | ICD-10-CM | POA: Diagnosis not present

## 2020-10-10 DIAGNOSIS — I10 Essential (primary) hypertension: Secondary | ICD-10-CM | POA: Diagnosis not present

## 2020-10-10 DIAGNOSIS — E119 Type 2 diabetes mellitus without complications: Secondary | ICD-10-CM | POA: Diagnosis not present

## 2020-10-10 DIAGNOSIS — N1831 Chronic kidney disease, stage 3a: Secondary | ICD-10-CM | POA: Diagnosis not present

## 2020-10-10 DIAGNOSIS — E7800 Pure hypercholesterolemia, unspecified: Secondary | ICD-10-CM | POA: Diagnosis not present

## 2021-01-10 ENCOUNTER — Emergency Department: Payer: Medicare Other

## 2021-01-10 ENCOUNTER — Emergency Department
Admission: EM | Admit: 2021-01-10 | Discharge: 2021-01-10 | Disposition: A | Discharge status: Home / Self Care | Payer: Medicare Other | Attending: Student in an Organized Health Care Education/Training Program | Admitting: Student in an Organized Health Care Education/Training Program

## 2021-01-10 ENCOUNTER — Other Ambulatory Visit: Payer: Self-pay

## 2021-01-10 DIAGNOSIS — I129 Hypertensive chronic kidney disease with stage 1 through stage 4 chronic kidney disease, or unspecified chronic kidney disease: Secondary | ICD-10-CM | POA: Insufficient documentation

## 2021-01-10 DIAGNOSIS — M79605 Pain in left leg: Secondary | ICD-10-CM | POA: Diagnosis not present

## 2021-01-10 DIAGNOSIS — M79662 Pain in left lower leg: Secondary | ICD-10-CM

## 2021-01-10 DIAGNOSIS — Z85048 Personal history of other malignant neoplasm of rectum, rectosigmoid junction, and anus: Secondary | ICD-10-CM | POA: Insufficient documentation

## 2021-01-10 DIAGNOSIS — N183 Chronic kidney disease, stage 3 unspecified: Secondary | ICD-10-CM | POA: Diagnosis not present

## 2021-01-10 LAB — BASIC METABOLIC PANEL
Anion gap: 7 (ref 5–15)
BUN: 34 mg/dL — ABNORMAL HIGH (ref 8–23)
CO2: 25 mmol/L (ref 22–32)
Calcium: 9.2 mg/dL (ref 8.9–10.3)
Chloride: 106 mmol/L (ref 98–111)
Creatinine, Ser: 1.24 mg/dL (ref 0.61–1.24)
GFR, Estimated: 56 mL/min — ABNORMAL LOW (ref >60)
Glucose, Bld: 114 mg/dL — ABNORMAL HIGH (ref 70–99)
Potassium: 4.4 mmol/L (ref 3.5–5.1)
Sodium: 138 mmol/L (ref 135–145)

## 2021-01-10 LAB — CBC WITH DIFFERENTIAL/PLATELET
Abs Immature Granulocytes: 0.01 10*3/uL (ref 0.00–0.07)
Basophils Absolute: 0 10*3/uL (ref 0.0–0.1)
Basophils Relative: 1 %
Eosinophils Absolute: 0.1 10*3/uL (ref 0.0–0.5)
Eosinophils Relative: 2 %
HCT: 43.2 % (ref 39.0–52.0)
Hemoglobin: 14.5 g/dL (ref 13.0–17.0)
Immature Granulocytes: 0 %
Lymphocytes Relative: 29 %
Lymphs Abs: 1.1 10*3/uL (ref 0.7–4.0)
MCH: 29.8 pg (ref 26.0–34.0)
MCHC: 33.6 g/dL (ref 30.0–36.0)
MCV: 88.7 fL (ref 80.0–100.0)
Monocytes Absolute: 0.4 10*3/uL (ref 0.1–1.0)
Monocytes Relative: 12 %
Neutro Abs: 2.1 10*3/uL (ref 1.7–7.7)
Neutrophils Relative %: 56 %
Platelets: 188 10*3/uL (ref 150–400)
RBC: 4.87 MIL/uL (ref 4.22–5.81)
RDW: 12.5 % (ref 11.5–15.5)
WBC: 3.7 10*3/uL — ABNORMAL LOW (ref 4.0–10.5)
nRBC: 0 % (ref 0.0–0.2)

## 2021-01-10 LAB — MAGNESIUM: Magnesium: 1.9 mg/dL (ref 1.7–2.4)

## 2021-01-10 MED ORDER — LIDOCAINE 5 % EX PTCH
1.0000 | MEDICATED_PATCH | CUTANEOUS | Status: DC
  Administered 2021-01-10: 1 via TRANSDERMAL
  Filled 2021-01-10: qty 1

## 2021-01-10 MED ORDER — LIDOCAINE 5 % EX PTCH: MEDICATED_PATCH | CUTANEOUS | 0 refills | Status: AC

## 2021-01-10 NOTE — Discharge Instructions (Signed)
Follow-up with your primary care provider if any continued problems or concerns.a Lidoderm patch has been applied to your leg to see if this helps prevent your leg pain tonight.  Your ultrasound shows that you do not have a blood clot to your leg.  Potassium and magnesium were normal.  A prescription for continued Lidoderm patches was attached to your discharge papers if this is helping you may use these at night.  They will not cause any drowsiness or increase your risk for falling.  Return to the emergency department if any severe worsening of your symptoms.

## 2021-01-10 NOTE — ED Triage Notes (Signed)
Pt to ED for left leg calf pain that started a week ago. Denies injury. Denies redness or swelling.

## 2021-01-10 NOTE — ED Provider Notes (Signed)
[facility] Provider Note    Event Date/Time   First MD Initiated Contact with Patient 01/10/21 1454     (approximate)   History   Leg Pain   HPI  Chad Key is a 88 y.o. male presents to the ED with complaint of left calf pain that started approximately 1 week ago.  Patient denies any injury.  He states that it wakes him up during the night.  He reports that it only happens at night while he is sleeping.  He has no difficulties during the day with his left leg.  He denies any redness or swelling to his leg.  He denies any previous injury to his leg and no recent travel.  No history of DVTs in the past.  He apparently was seen at an urgent care and sent to the emergency department to rule out DVT.  Patient has a history of chronic kidney disease stage III with elevated creatinines, BPH with urinary retention, Hypertension and rectal cancer.  Currently rates his pain as a 0/10.      Physical Exam   Triage Vital Signs: ED Triage Vitals  Enc Vitals Group     BP 01/10/21 1358 (!) 144/113     Pulse Rate 01/10/21 1358 76     Resp 01/10/21 1358 20     Temp 01/10/21 1359 97.7 F (36.5 C)     Temp Source 01/10/21 1358 Oral     SpO2 01/10/21 1358 95 %     Weight 01/10/21 1357 235 lb (106.6 kg)     Height 01/10/21 1357 6\' 1"  (1.854 m)     Head Circumference --      Peak Flow --      Pain Score 01/10/21 1356 0     Pain Loc --      Pain Edu? --      Excl. in [place]? --     Most recent vital signs: Vitals:   01/10/21 1631 01/10/21 1656  BP: 132/68 132/68  Pulse: 81 81  Resp: 20 20  Temp:    SpO2: 96% 100%     General: Awake, no distress.  CV:  Good peripheral perfusion.  Heart regular rate and rhythm without murmur. Resp:  Normal effort.  Lungs are clear bilaterally. Abd:  No distention.  Other:  On examination of the left lower extremity there is no gross deformity.  No pitting edema, warmth, redness, tenderness to palpation of the calf.  Skin is  intact.  No evidence of injury.  Range of motion is without restriction.  No point tenderness on palpation of the posterior lower extremity.  Pulses present.  Patient was ambulatory without any assistance.   ED Results / Procedures / Treatments   Labs (all labs ordered are listed, but only abnormal results are displayed) Labs Reviewed  BASIC METABOLIC PANEL - Abnormal; Notable for the following components:      Result Value   Glucose, Bld 114 (*)    BUN 34 (*)    GFR, Estimated 56 (*)    All other components within normal limits  CBC WITH DIFFERENTIAL/PLATELET - Abnormal; Notable for the following components:   WBC 3.7 (*)    All other components within normal limits  MAGNESIUM      RADIOLOGY Venous ultrasound remedy per radiology report and reviewed was negative for DVT.  PROCEDURES:  Critical Care performed: No  Procedures   MEDICATIONS ORDERED IN ED: Medications  lidocaine (LIDODERM) 5 % 1 patch (  1 patch Transdermal Patch Applied 01/10/21 1638)     IMPRESSION / MDM / ASSESSMENT AND PLAN / ED COURSE  I reviewed the triage vital signs and the nursing notes.                              Differential diagnosis includes, but is not limited to, DVT left lower extremity, muscle cramps, muscle strain, nonspecific left leg pain while sleeping.  88 year old male presents to the ED with complaint of left leg pain only when sleeping at night.  Patient has difficulty describing it as a "cramp" and denies any injury to his leg that would explain this.  He states he has no difficulty during the day and has not taken any over-the-counter medications for this.  He was seen initially at urgent care who sent him to the emergency department to rule out DVT.  No recent injury or traveling.  On physical exam there is no erythema, warmth, tenderness or edema present.  No tenderness on palpation.  Venous ultrasound was negative for DVT.  Also lab work was reassuring and that his potassium and  magnesium were within normal limits.  BUN was elevated however previous lab work shows that his BUN has been elevated in the past as high as 35 and as low as 26.  In talking with him I told him that we would try the Lidoderm patch to apply to his calf area to see if this helps with his pain especially while sleeping.  I do not want to use any type of medication that would cause drowsiness and increase his risk for falling.  He is agreeable to try this.  A prescription was given to him so that if this patch that is applied to his leg tonight helps he will have a prescription for the same.  He is to follow-up with his primary care provider if any continued problems and also for continued medication if this is working.       FINAL CLINICAL IMPRESSION(S) / ED DIAGNOSES   Final diagnoses:  Left leg pain     Rx / DC Orders   ED Discharge Orders          Ordered    lidocaine (LIDODERM) 5 %        01/10/21 1631             Note:  This document was prepared using Dragon voice recognition software and may include unintentional dictation errors.   Johnn Hai, PA-C 01/10/21 1659    Merlyn Lot, MD 01/11/21 1027

## 2021-01-18 DIAGNOSIS — Z933 Colostomy status: Secondary | ICD-10-CM | POA: Diagnosis not present

## 2021-04-12 DIAGNOSIS — E7800 Pure hypercholesterolemia, unspecified: Secondary | ICD-10-CM | POA: Diagnosis not present

## 2021-04-12 DIAGNOSIS — I10 Essential (primary) hypertension: Secondary | ICD-10-CM | POA: Diagnosis not present

## 2021-04-12 DIAGNOSIS — N1831 Chronic kidney disease, stage 3a: Secondary | ICD-10-CM | POA: Diagnosis not present

## 2021-04-12 DIAGNOSIS — E119 Type 2 diabetes mellitus without complications: Secondary | ICD-10-CM | POA: Diagnosis not present

## 2021-05-15 DIAGNOSIS — N183 Chronic kidney disease, stage 3 unspecified: Secondary | ICD-10-CM | POA: Diagnosis not present

## 2021-05-20 DIAGNOSIS — H26493 Other secondary cataract, bilateral: Secondary | ICD-10-CM | POA: Diagnosis not present

## 2021-05-20 DIAGNOSIS — H52223 Regular astigmatism, bilateral: Secondary | ICD-10-CM | POA: Diagnosis not present

## 2021-05-20 DIAGNOSIS — H5203 Hypermetropia, bilateral: Secondary | ICD-10-CM | POA: Diagnosis not present

## 2021-05-22 DIAGNOSIS — H526 Other disorders of refraction: Secondary | ICD-10-CM | POA: Diagnosis not present

## 2021-10-18 DIAGNOSIS — I10 Essential (primary) hypertension: Secondary | ICD-10-CM | POA: Diagnosis not present

## 2021-10-18 DIAGNOSIS — N1831 Chronic kidney disease, stage 3a: Secondary | ICD-10-CM | POA: Diagnosis not present

## 2021-10-18 DIAGNOSIS — E7800 Pure hypercholesterolemia, unspecified: Secondary | ICD-10-CM | POA: Diagnosis not present

## 2021-10-18 DIAGNOSIS — E119 Type 2 diabetes mellitus without complications: Secondary | ICD-10-CM | POA: Diagnosis not present

## 2021-12-07 DIAGNOSIS — I10 Essential (primary) hypertension: Secondary | ICD-10-CM | POA: Diagnosis not present

## 2021-12-07 DIAGNOSIS — N1831 Chronic kidney disease, stage 3a: Secondary | ICD-10-CM | POA: Diagnosis not present

## 2021-12-27 DIAGNOSIS — I10 Essential (primary) hypertension: Secondary | ICD-10-CM | POA: Diagnosis not present

## 2021-12-27 DIAGNOSIS — R531 Weakness: Secondary | ICD-10-CM | POA: Diagnosis not present

## 2021-12-27 DIAGNOSIS — M25521 Pain in right elbow: Secondary | ICD-10-CM | POA: Diagnosis not present

## 2022-01-02 DIAGNOSIS — N183 Chronic kidney disease, stage 3 unspecified: Secondary | ICD-10-CM | POA: Diagnosis not present

## 2022-01-02 DIAGNOSIS — M25521 Pain in right elbow: Secondary | ICD-10-CM | POA: Diagnosis not present

## 2022-01-02 DIAGNOSIS — I10 Essential (primary) hypertension: Secondary | ICD-10-CM | POA: Diagnosis not present

## 2022-02-05 DIAGNOSIS — N183 Chronic kidney disease, stage 3 unspecified: Secondary | ICD-10-CM | POA: Diagnosis not present

## 2022-02-05 DIAGNOSIS — I10 Essential (primary) hypertension: Secondary | ICD-10-CM | POA: Diagnosis not present

## 2022-02-05 DIAGNOSIS — E7800 Pure hypercholesterolemia, unspecified: Secondary | ICD-10-CM | POA: Diagnosis not present

## 2022-02-05 DIAGNOSIS — E119 Type 2 diabetes mellitus without complications: Secondary | ICD-10-CM | POA: Diagnosis not present

## 2022-02-05 DIAGNOSIS — M109 Gout, unspecified: Secondary | ICD-10-CM | POA: Diagnosis not present

## 2022-02-17 DIAGNOSIS — Z933 Colostomy status: Secondary | ICD-10-CM | POA: Diagnosis not present

## 2022-02-28 DIAGNOSIS — N183 Chronic kidney disease, stage 3 unspecified: Secondary | ICD-10-CM | POA: Diagnosis not present

## 2022-02-28 DIAGNOSIS — M109 Gout, unspecified: Secondary | ICD-10-CM | POA: Diagnosis not present

## 2022-04-20 DIAGNOSIS — E119 Type 2 diabetes mellitus without complications: Secondary | ICD-10-CM | POA: Diagnosis not present

## 2022-04-20 DIAGNOSIS — N183 Chronic kidney disease, stage 3 unspecified: Secondary | ICD-10-CM | POA: Diagnosis not present

## 2022-04-20 DIAGNOSIS — I10 Essential (primary) hypertension: Secondary | ICD-10-CM | POA: Diagnosis not present

## 2022-04-20 DIAGNOSIS — M109 Gout, unspecified: Secondary | ICD-10-CM | POA: Diagnosis not present

## 2022-07-27 DIAGNOSIS — I10 Essential (primary) hypertension: Secondary | ICD-10-CM | POA: Diagnosis not present

## 2022-07-27 DIAGNOSIS — M109 Gout, unspecified: Secondary | ICD-10-CM | POA: Diagnosis not present

## 2022-07-27 DIAGNOSIS — N183 Chronic kidney disease, stage 3 unspecified: Secondary | ICD-10-CM | POA: Diagnosis not present

## 2022-07-27 DIAGNOSIS — E119 Type 2 diabetes mellitus without complications: Secondary | ICD-10-CM | POA: Diagnosis not present

## 2022-10-03 DIAGNOSIS — Z933 Colostomy status: Secondary | ICD-10-CM | POA: Diagnosis not present

## 2022-10-29 DIAGNOSIS — I10 Essential (primary) hypertension: Secondary | ICD-10-CM | POA: Diagnosis not present

## 2022-10-29 DIAGNOSIS — Z23 Encounter for immunization: Secondary | ICD-10-CM | POA: Diagnosis not present

## 2022-10-29 DIAGNOSIS — E119 Type 2 diabetes mellitus without complications: Secondary | ICD-10-CM | POA: Diagnosis not present

## 2022-10-29 DIAGNOSIS — M109 Gout, unspecified: Secondary | ICD-10-CM | POA: Diagnosis not present

## 2022-10-29 DIAGNOSIS — N183 Chronic kidney disease, stage 3 unspecified: Secondary | ICD-10-CM | POA: Diagnosis not present

## 2023-02-06 DIAGNOSIS — M109 Gout, unspecified: Secondary | ICD-10-CM | POA: Diagnosis not present

## 2023-02-06 DIAGNOSIS — E7800 Pure hypercholesterolemia, unspecified: Secondary | ICD-10-CM | POA: Diagnosis not present

## 2023-02-06 DIAGNOSIS — Z23 Encounter for immunization: Secondary | ICD-10-CM | POA: Diagnosis not present

## 2023-02-06 DIAGNOSIS — E119 Type 2 diabetes mellitus without complications: Secondary | ICD-10-CM | POA: Diagnosis not present

## 2023-02-06 DIAGNOSIS — N183 Chronic kidney disease, stage 3 unspecified: Secondary | ICD-10-CM | POA: Diagnosis not present

## 2023-02-06 DIAGNOSIS — I10 Essential (primary) hypertension: Secondary | ICD-10-CM | POA: Diagnosis not present

## 2023-05-01 DIAGNOSIS — Z933 Colostomy status: Secondary | ICD-10-CM | POA: Diagnosis not present

## 2023-05-14 DIAGNOSIS — I10 Essential (primary) hypertension: Secondary | ICD-10-CM | POA: Diagnosis not present

## 2023-05-14 DIAGNOSIS — M109 Gout, unspecified: Secondary | ICD-10-CM | POA: Diagnosis not present

## 2023-05-14 DIAGNOSIS — Z1331 Encounter for screening for depression: Secondary | ICD-10-CM | POA: Diagnosis not present

## 2023-05-14 DIAGNOSIS — N183 Chronic kidney disease, stage 3 unspecified: Secondary | ICD-10-CM | POA: Diagnosis not present

## 2023-05-14 DIAGNOSIS — Z9181 History of falling: Secondary | ICD-10-CM | POA: Diagnosis not present

## 2023-05-14 DIAGNOSIS — E119 Type 2 diabetes mellitus without complications: Secondary | ICD-10-CM | POA: Diagnosis not present

## 2023-05-14 DIAGNOSIS — Z139 Encounter for screening, unspecified: Secondary | ICD-10-CM | POA: Diagnosis not present

## 2023-08-20 DIAGNOSIS — E119 Type 2 diabetes mellitus without complications: Secondary | ICD-10-CM | POA: Diagnosis not present

## 2023-08-20 DIAGNOSIS — M109 Gout, unspecified: Secondary | ICD-10-CM | POA: Diagnosis not present

## 2023-08-20 DIAGNOSIS — I10 Essential (primary) hypertension: Secondary | ICD-10-CM | POA: Diagnosis not present

## 2023-08-20 DIAGNOSIS — N183 Chronic kidney disease, stage 3 unspecified: Secondary | ICD-10-CM | POA: Diagnosis not present

## 2023-08-20 DIAGNOSIS — E7800 Pure hypercholesterolemia, unspecified: Secondary | ICD-10-CM | POA: Diagnosis not present

## 2023-11-26 DIAGNOSIS — Z23 Encounter for immunization: Secondary | ICD-10-CM | POA: Diagnosis not present

## 2023-11-26 DIAGNOSIS — N183 Chronic kidney disease, stage 3 unspecified: Secondary | ICD-10-CM | POA: Diagnosis not present

## 2023-11-26 DIAGNOSIS — E7800 Pure hypercholesterolemia, unspecified: Secondary | ICD-10-CM | POA: Diagnosis not present

## 2023-11-26 DIAGNOSIS — M109 Gout, unspecified: Secondary | ICD-10-CM | POA: Diagnosis not present

## 2023-11-26 DIAGNOSIS — E119 Type 2 diabetes mellitus without complications: Secondary | ICD-10-CM | POA: Diagnosis not present

## 2023-11-26 DIAGNOSIS — I10 Essential (primary) hypertension: Secondary | ICD-10-CM | POA: Diagnosis not present

## 2023-12-05 DIAGNOSIS — M6281 Muscle weakness (generalized): Secondary | ICD-10-CM | POA: Diagnosis not present

## 2023-12-05 DIAGNOSIS — R262 Difficulty in walking, not elsewhere classified: Secondary | ICD-10-CM | POA: Diagnosis not present

## 2023-12-10 DIAGNOSIS — R262 Difficulty in walking, not elsewhere classified: Secondary | ICD-10-CM | POA: Diagnosis not present

## 2023-12-10 DIAGNOSIS — M6281 Muscle weakness (generalized): Secondary | ICD-10-CM | POA: Diagnosis not present

## 2023-12-12 DIAGNOSIS — M6281 Muscle weakness (generalized): Secondary | ICD-10-CM | POA: Diagnosis not present

## 2023-12-12 DIAGNOSIS — R262 Difficulty in walking, not elsewhere classified: Secondary | ICD-10-CM | POA: Diagnosis not present

## 2023-12-17 DIAGNOSIS — R262 Difficulty in walking, not elsewhere classified: Secondary | ICD-10-CM | POA: Diagnosis not present

## 2023-12-17 DIAGNOSIS — M6281 Muscle weakness (generalized): Secondary | ICD-10-CM | POA: Diagnosis not present

## 2023-12-19 DIAGNOSIS — R262 Difficulty in walking, not elsewhere classified: Secondary | ICD-10-CM | POA: Diagnosis not present

## 2023-12-19 DIAGNOSIS — M6281 Muscle weakness (generalized): Secondary | ICD-10-CM | POA: Diagnosis not present
